# Patient Record
Sex: Female | Born: 1965
Health system: Southern US, Community
[De-identification: ages and names within clinical notes are randomized; demographics above are authoritative.]

## PROBLEM LIST (undated history)

## (undated) DIAGNOSIS — J45909 Unspecified asthma, uncomplicated: Secondary | ICD-10-CM

## (undated) DIAGNOSIS — R51 Headache: Secondary | ICD-10-CM

## (undated) DIAGNOSIS — F32A Depression, unspecified: Secondary | ICD-10-CM

## (undated) DIAGNOSIS — R519 Headache, unspecified: Secondary | ICD-10-CM

## (undated) DIAGNOSIS — F329 Major depressive disorder, single episode, unspecified: Secondary | ICD-10-CM

## (undated) DIAGNOSIS — Z8719 Personal history of other diseases of the digestive system: Secondary | ICD-10-CM

## (undated) DIAGNOSIS — K219 Gastro-esophageal reflux disease without esophagitis: Secondary | ICD-10-CM

## (undated) HISTORY — PX: CHOLECYSTECTOMY: SHX55

## (undated) HISTORY — PX: MANDIBLE RECONSTRUCTION: SHX431

## (undated) HISTORY — PX: LYMPH NODE BIOPSY: SHX201

## (undated) HISTORY — PX: DIAGNOSTIC LAPAROSCOPY: SUR761

## (undated) HISTORY — PX: BREAST BIOPSY: SHX20

## (undated) HISTORY — PX: TYMPANOSTOMY TUBE PLACEMENT: SHX32

## (undated) HISTORY — PX: OTHER SURGICAL HISTORY: SHX169

---

## 1989-02-28 HISTORY — PX: NASAL SINUS SURGERY: SHX719

## 2008-03-20 ENCOUNTER — Encounter: Admission: RE | Admit: 2008-03-20 | Discharge: 2008-03-20 | Payer: Self-pay | Admitting: Otolaryngology

## 2008-03-31 ENCOUNTER — Emergency Department (HOSPITAL_COMMUNITY): Admission: EM | Admit: 2008-03-31 | Discharge: 2008-03-31 | Payer: Self-pay | Admitting: Emergency Medicine

## 2008-04-29 ENCOUNTER — Other Ambulatory Visit: Admission: RE | Admit: 2008-04-29 | Discharge: 2008-04-29 | Payer: Self-pay | Admitting: Family Medicine

## 2008-11-25 ENCOUNTER — Encounter: Admission: RE | Admit: 2008-11-25 | Discharge: 2008-11-25 | Payer: Self-pay | Admitting: Family Medicine

## 2008-12-10 ENCOUNTER — Encounter: Admission: RE | Admit: 2008-12-10 | Discharge: 2008-12-10 | Payer: Self-pay | Admitting: Family Medicine

## 2009-04-09 ENCOUNTER — Encounter: Admission: RE | Admit: 2009-04-09 | Discharge: 2009-04-09 | Payer: Self-pay | Admitting: Family Medicine

## 2009-05-19 ENCOUNTER — Other Ambulatory Visit: Admission: RE | Admit: 2009-05-19 | Discharge: 2009-05-19 | Payer: Self-pay | Admitting: Family Medicine

## 2009-06-09 ENCOUNTER — Encounter: Admission: RE | Admit: 2009-06-09 | Discharge: 2009-06-09 | Payer: Self-pay | Admitting: Family Medicine

## 2009-06-11 ENCOUNTER — Encounter: Admission: RE | Admit: 2009-06-11 | Discharge: 2009-06-11 | Payer: Self-pay | Admitting: Family Medicine

## 2010-03-21 ENCOUNTER — Encounter: Payer: Self-pay | Admitting: Family Medicine

## 2010-03-23 ENCOUNTER — Other Ambulatory Visit: Payer: Self-pay | Admitting: Obstetrics and Gynecology

## 2010-05-04 ENCOUNTER — Other Ambulatory Visit: Payer: Self-pay | Admitting: Family Medicine

## 2010-05-04 DIAGNOSIS — Z139 Encounter for screening, unspecified: Secondary | ICD-10-CM

## 2010-06-15 LAB — URINALYSIS, ROUTINE W REFLEX MICROSCOPIC
Ketones, ur: NEGATIVE mg/dL
Protein, ur: NEGATIVE mg/dL
Specific Gravity, Urine: 1.011 (ref 1.005–1.030)
pH: 7.5 (ref 5.0–8.0)

## 2010-06-15 LAB — POCT CARDIAC MARKERS
CKMB, poc: <1 ng/mL — ABNORMAL LOW (ref 1.0–8.0)
Troponin i, poc: <0.05 ng/mL (ref 0.00–0.09)
Troponin i, poc: <0.05 ng/mL (ref 0.00–0.09)

## 2010-06-15 LAB — COMPREHENSIVE METABOLIC PANEL
Albumin: 3.5 g/dL (ref 3.5–5.2)
CO2: 23 mEq/L (ref 19–32)
Calcium: 8.9 mg/dL (ref 8.4–10.5)
GFR calc Af Amer: >60 mL/min (ref >60)
Total Protein: 6.1 g/dL (ref 6.0–8.3)

## 2010-06-15 LAB — LIPASE, BLOOD: Lipase: 22 U/L (ref 11–59)

## 2010-06-15 LAB — D-DIMER, QUANTITATIVE: D-Dimer, Quant: 0.32 ug/mL-FEU (ref 0.00–0.48)

## 2010-06-15 LAB — PROTIME-INR: Prothrombin Time: 12.7 seconds (ref 11.6–15.2)

## 2010-06-17 ENCOUNTER — Ambulatory Visit
Admission: RE | Admit: 2010-06-17 | Discharge: 2010-06-17 | Disposition: A | Discharge status: Home / Self Care | Payer: BC Managed Care – PPO | Source: Ambulatory Visit | Attending: Family Medicine | Admitting: Family Medicine

## 2010-06-17 DIAGNOSIS — Z139 Encounter for screening, unspecified: Secondary | ICD-10-CM

## 2010-07-15 ENCOUNTER — Emergency Department (HOSPITAL_COMMUNITY): Payer: BC Managed Care – PPO

## 2010-07-15 ENCOUNTER — Emergency Department (HOSPITAL_COMMUNITY)
Admission: EM | Admit: 2010-07-15 | Discharge: 2010-07-15 | Disposition: A | Discharge status: Home / Self Care | Payer: BC Managed Care – PPO | Attending: Emergency Medicine | Admitting: Emergency Medicine

## 2010-07-15 DIAGNOSIS — F329 Major depressive disorder, single episode, unspecified: Secondary | ICD-10-CM | POA: Insufficient documentation

## 2010-07-15 DIAGNOSIS — E7800 Pure hypercholesterolemia, unspecified: Secondary | ICD-10-CM | POA: Insufficient documentation

## 2010-07-15 DIAGNOSIS — M549 Dorsalgia, unspecified: Secondary | ICD-10-CM | POA: Insufficient documentation

## 2010-07-15 DIAGNOSIS — K219 Gastro-esophageal reflux disease without esophagitis: Secondary | ICD-10-CM | POA: Insufficient documentation

## 2010-07-15 DIAGNOSIS — F3289 Other specified depressive episodes: Secondary | ICD-10-CM | POA: Insufficient documentation

## 2010-07-15 DIAGNOSIS — I10 Essential (primary) hypertension: Secondary | ICD-10-CM | POA: Insufficient documentation

## 2010-07-15 DIAGNOSIS — G43909 Migraine, unspecified, not intractable, without status migrainosus: Secondary | ICD-10-CM | POA: Insufficient documentation

## 2010-07-15 DIAGNOSIS — Z87442 Personal history of urinary calculi: Secondary | ICD-10-CM | POA: Insufficient documentation

## 2010-07-15 DIAGNOSIS — Z79899 Other long term (current) drug therapy: Secondary | ICD-10-CM | POA: Insufficient documentation

## 2010-07-15 DIAGNOSIS — K589 Irritable bowel syndrome without diarrhea: Secondary | ICD-10-CM | POA: Insufficient documentation

## 2010-07-15 LAB — URINALYSIS, ROUTINE W REFLEX MICROSCOPIC
Bilirubin Urine: NEGATIVE
Ketones, ur: NEGATIVE mg/dL
Protein, ur: NEGATIVE mg/dL
pH: 7 (ref 5.0–8.0)

## 2010-07-15 LAB — POCT I-STAT, CHEM 8
BUN: 11 mg/dL (ref 6–23)
Chloride: 110 mEq/L (ref 96–112)
Creatinine, Ser: 1.2 mg/dL (ref 0.4–1.2)
Potassium: 3.8 mEq/L (ref 3.5–5.1)

## 2010-07-18 LAB — URINE CULTURE: Colony Count: 40000

## 2011-04-12 ENCOUNTER — Other Ambulatory Visit (HOSPITAL_COMMUNITY): Payer: Self-pay | Admitting: Family Medicine

## 2011-04-12 DIAGNOSIS — R4702 Dysphasia: Secondary | ICD-10-CM

## 2011-04-15 ENCOUNTER — Ambulatory Visit (HOSPITAL_COMMUNITY)
Admission: RE | Admit: 2011-04-15 | Discharge: 2011-04-15 | Disposition: A | Discharge status: Home / Self Care | Payer: BC Managed Care – PPO | Source: Ambulatory Visit | Attending: Family Medicine | Admitting: Family Medicine

## 2011-04-15 DIAGNOSIS — R1310 Dysphagia, unspecified: Secondary | ICD-10-CM | POA: Insufficient documentation

## 2011-04-15 DIAGNOSIS — R4702 Dysphasia: Secondary | ICD-10-CM

## 2011-04-15 DIAGNOSIS — K219 Gastro-esophageal reflux disease without esophagitis: Secondary | ICD-10-CM | POA: Insufficient documentation

## 2011-04-15 DIAGNOSIS — K224 Dyskinesia of esophagus: Secondary | ICD-10-CM | POA: Insufficient documentation

## 2011-05-04 ENCOUNTER — Encounter: Payer: Self-pay | Admitting: Internal Medicine

## 2011-05-05 ENCOUNTER — Ambulatory Visit: Payer: BC Managed Care – PPO | Admitting: Internal Medicine

## 2011-06-27 ENCOUNTER — Other Ambulatory Visit: Payer: Self-pay | Admitting: Family Medicine

## 2011-06-27 DIAGNOSIS — Z1231 Encounter for screening mammogram for malignant neoplasm of breast: Secondary | ICD-10-CM

## 2011-07-01 ENCOUNTER — Ambulatory Visit
Admission: RE | Admit: 2011-07-01 | Discharge: 2011-07-01 | Disposition: A | Discharge status: Home / Self Care | Payer: BC Managed Care – PPO | Source: Ambulatory Visit | Attending: Family Medicine | Admitting: Family Medicine

## 2011-07-01 DIAGNOSIS — Z1231 Encounter for screening mammogram for malignant neoplasm of breast: Secondary | ICD-10-CM

## 2011-07-05 ENCOUNTER — Other Ambulatory Visit: Payer: Self-pay | Admitting: Family Medicine

## 2011-07-05 DIAGNOSIS — R928 Other abnormal and inconclusive findings on diagnostic imaging of breast: Secondary | ICD-10-CM

## 2011-07-11 ENCOUNTER — Ambulatory Visit
Admission: RE | Admit: 2011-07-11 | Discharge: 2011-07-11 | Disposition: A | Discharge status: Home / Self Care | Payer: BC Managed Care – PPO | Source: Ambulatory Visit | Attending: Family Medicine | Admitting: Family Medicine

## 2011-07-11 DIAGNOSIS — R928 Other abnormal and inconclusive findings on diagnostic imaging of breast: Secondary | ICD-10-CM

## 2011-07-14 ENCOUNTER — Other Ambulatory Visit (HOSPITAL_COMMUNITY)
Admission: RE | Admit: 2011-07-14 | Discharge: 2011-07-14 | Disposition: A | Discharge status: Home / Self Care | Payer: BC Managed Care – PPO | Source: Ambulatory Visit | Attending: Family Medicine | Admitting: Family Medicine

## 2011-07-14 ENCOUNTER — Other Ambulatory Visit: Payer: Self-pay | Admitting: Physician Assistant

## 2011-07-14 DIAGNOSIS — R87810 Cervical high risk human papillomavirus (HPV) DNA test positive: Secondary | ICD-10-CM | POA: Insufficient documentation

## 2011-07-14 DIAGNOSIS — Z0000 Encounter for general adult medical examination without abnormal findings: Secondary | ICD-10-CM | POA: Insufficient documentation

## 2011-08-18 ENCOUNTER — Other Ambulatory Visit: Payer: Self-pay | Admitting: Obstetrics and Gynecology

## 2011-12-06 ENCOUNTER — Other Ambulatory Visit: Payer: Self-pay | Admitting: Family Medicine

## 2011-12-06 DIAGNOSIS — R921 Mammographic calcification found on diagnostic imaging of breast: Secondary | ICD-10-CM

## 2012-01-06 ENCOUNTER — Other Ambulatory Visit: Payer: Self-pay | Admitting: Family Medicine

## 2012-01-06 ENCOUNTER — Ambulatory Visit
Admission: RE | Admit: 2012-01-06 | Discharge: 2012-01-06 | Disposition: A | Discharge status: Home / Self Care | Payer: BC Managed Care – PPO | Source: Ambulatory Visit | Attending: Family Medicine | Admitting: Family Medicine

## 2012-01-06 DIAGNOSIS — R921 Mammographic calcification found on diagnostic imaging of breast: Secondary | ICD-10-CM

## 2012-04-26 ENCOUNTER — Other Ambulatory Visit: Payer: Self-pay | Admitting: Family Medicine

## 2012-04-26 DIAGNOSIS — R921 Mammographic calcification found on diagnostic imaging of breast: Secondary | ICD-10-CM

## 2012-06-28 ENCOUNTER — Other Ambulatory Visit: Payer: Self-pay | Admitting: Family Medicine

## 2012-06-28 ENCOUNTER — Ambulatory Visit
Admission: RE | Admit: 2012-06-28 | Discharge: 2012-06-28 | Disposition: A | Discharge status: Home / Self Care | Payer: BC Managed Care – PPO | Source: Ambulatory Visit | Attending: Family Medicine | Admitting: Family Medicine

## 2012-06-28 DIAGNOSIS — R921 Mammographic calcification found on diagnostic imaging of breast: Secondary | ICD-10-CM

## 2012-07-24 ENCOUNTER — Other Ambulatory Visit (HOSPITAL_COMMUNITY)
Admission: RE | Admit: 2012-07-24 | Discharge: 2012-07-24 | Disposition: A | Discharge status: Home / Self Care | Payer: BC Managed Care – PPO | Source: Ambulatory Visit | Attending: Obstetrics and Gynecology | Admitting: Obstetrics and Gynecology

## 2012-07-24 ENCOUNTER — Other Ambulatory Visit: Payer: Self-pay | Admitting: Nurse Practitioner

## 2012-07-24 DIAGNOSIS — Z01419 Encounter for gynecological examination (general) (routine) without abnormal findings: Secondary | ICD-10-CM | POA: Insufficient documentation

## 2012-11-27 ENCOUNTER — Other Ambulatory Visit: Payer: Self-pay | Admitting: Family Medicine

## 2012-11-27 ENCOUNTER — Ambulatory Visit
Admission: RE | Admit: 2012-11-27 | Discharge: 2012-11-27 | Disposition: A | Discharge status: Home / Self Care | Payer: BC Managed Care – PPO | Source: Ambulatory Visit | Attending: Physician Assistant | Admitting: Physician Assistant

## 2012-11-27 DIAGNOSIS — M25561 Pain in right knee: Secondary | ICD-10-CM

## 2012-11-28 ENCOUNTER — Other Ambulatory Visit: Payer: Self-pay | Admitting: Family Medicine

## 2012-11-28 DIAGNOSIS — M25561 Pain in right knee: Secondary | ICD-10-CM

## 2013-01-01 ENCOUNTER — Other Ambulatory Visit: Payer: Self-pay | Admitting: Gastroenterology

## 2013-04-29 ENCOUNTER — Other Ambulatory Visit: Payer: Self-pay | Admitting: Occupational Medicine

## 2013-04-29 ENCOUNTER — Ambulatory Visit: Payer: Self-pay

## 2013-04-29 DIAGNOSIS — M25512 Pain in left shoulder: Secondary | ICD-10-CM

## 2013-05-16 ENCOUNTER — Other Ambulatory Visit: Payer: Self-pay

## 2013-05-16 DIAGNOSIS — Z1231 Encounter for screening mammogram for malignant neoplasm of breast: Secondary | ICD-10-CM

## 2013-07-02 ENCOUNTER — Ambulatory Visit
Admission: RE | Admit: 2013-07-02 | Discharge: 2013-07-02 | Disposition: A | Discharge status: Home / Self Care | Payer: No Typology Code available for payment source | Source: Ambulatory Visit

## 2013-07-02 ENCOUNTER — Encounter (INDEPENDENT_AMBULATORY_CARE_PROVIDER_SITE_OTHER): Payer: Self-pay

## 2013-07-02 DIAGNOSIS — Z1231 Encounter for screening mammogram for malignant neoplasm of breast: Secondary | ICD-10-CM

## 2013-11-12 ENCOUNTER — Other Ambulatory Visit: Payer: Self-pay | Admitting: Family Medicine

## 2013-11-12 ENCOUNTER — Ambulatory Visit
Admission: RE | Admit: 2013-11-12 | Discharge: 2013-11-12 | Disposition: A | Discharge status: Home / Self Care | Payer: No Typology Code available for payment source | Source: Ambulatory Visit | Attending: Family Medicine | Admitting: Family Medicine

## 2013-11-12 DIAGNOSIS — M25531 Pain in right wrist: Secondary | ICD-10-CM

## 2013-11-12 DIAGNOSIS — M25511 Pain in right shoulder: Secondary | ICD-10-CM

## 2014-04-21 ENCOUNTER — Other Ambulatory Visit: Payer: Self-pay

## 2014-04-21 DIAGNOSIS — Z1231 Encounter for screening mammogram for malignant neoplasm of breast: Secondary | ICD-10-CM

## 2014-05-30 ENCOUNTER — Other Ambulatory Visit (HOSPITAL_COMMUNITY)
Admission: RE | Admit: 2014-05-30 | Discharge: 2014-05-30 | Disposition: A | Discharge status: Home / Self Care | Payer: No Typology Code available for payment source | Source: Ambulatory Visit | Attending: Obstetrics & Gynecology | Admitting: Obstetrics & Gynecology

## 2014-05-30 ENCOUNTER — Other Ambulatory Visit: Payer: Self-pay | Admitting: Obstetrics & Gynecology

## 2014-05-30 DIAGNOSIS — Z01419 Encounter for gynecological examination (general) (routine) without abnormal findings: Secondary | ICD-10-CM | POA: Insufficient documentation

## 2014-06-02 LAB — CYTOLOGY - PAP

## 2014-06-18 ENCOUNTER — Encounter: Payer: Self-pay | Admitting: Physical Therapy

## 2014-06-18 ENCOUNTER — Ambulatory Visit: Payer: No Typology Code available for payment source | Attending: Obstetrics & Gynecology | Admitting: Physical Therapy

## 2014-06-18 DIAGNOSIS — R32 Unspecified urinary incontinence: Secondary | ICD-10-CM | POA: Insufficient documentation

## 2014-06-18 DIAGNOSIS — N8189 Other female genital prolapse: Secondary | ICD-10-CM

## 2014-06-18 DIAGNOSIS — M6289 Other specified disorders of muscle: Secondary | ICD-10-CM

## 2014-06-18 DIAGNOSIS — M545 Low back pain, unspecified: Secondary | ICD-10-CM

## 2014-06-18 DIAGNOSIS — N8184 Pelvic muscle wasting: Secondary | ICD-10-CM | POA: Insufficient documentation

## 2014-06-18 NOTE — Therapy (Signed)
[facility] [address] [place], [place], [address] Phone: [phone]   Fax:  [phone]  Physical Therapy Evaluation  Patient Details  Name: Morgan Hooper MRN: [id number] Date of Birth: [birth year] Referring Provider:  [name], DO  Encounter Date: 06/18/2014      PT End of Session - 06/18/14 1710    Visit Number 1   Date for PT Re-Evaluation 09/10/14   PT Start Time 1324   PT Stop Time 1700   PT Time Calculation (min) 45 min   Activity Tolerance Patient tolerated treatment well   Behavior During Therapy [facility] for tasks assessed/performed      History reviewed. No pertinent past medical history.  History reviewed. No pertinent past surgical history.  There were no vitals filed for this visit.  Visit Diagnosis:  Pelvic floor dysfunction - Plan: PT plan of care cert/re-cert  Pelvic floor weakness in female - Plan: PT plan of care cert/re-cert  Midline low back pain without sciatica - Plan: PT plan of care cert/re-cert      Subjective Assessment - 06/18/14 1629    Subjective Patient reports pelvic pain, prolapsing uterus.  Patient has pain with intercourse.  Urinary leakage with laughing, running. Patient gets the urge to  urinate.  Patient reports back pain. Pain has been getting worse since  training in mud run. Patient unable to be on top during intercourse due to pain.  Positions are husband on top or on side is most comfortable.    Limitations Other (comment)  intercourse   How long can you stand comfortably? standing helps   How long can you walk comfortably? needs to have a bathroom available during walks   Patient Stated Goals reduce urinary leakage, reduce pain   Currently in Pain? Yes   Pain Score 10-Worst pain ever  low 4/10   Pain Location Vagina  back, pelvic   Pain Orientation Mid;Lower;Right   Pain Descriptors / Indicators Aching;Burning;Stabbing  grabbing   Pain Type Chronic  pain   Pain Onset More than a month ago  2 months   Pain Frequency Intermittent   Aggravating Factors  transferring patients, running, intercourse   Pain Relieving Factors changing positions, change positions with intercourse   Effect of Pain on Daily Activities intercourse   Multiple Pain Sites No            [facility] PT Assessment - 06/18/14 0001    Assessment   Medical Diagnosis Pelvic pain   Onset Date 03/31/14   Next MD Visit 06/24/2014   Prior Therapy None   Precautions   Precautions None   Balance Screen   Has the patient fallen in the past 6 months No   Has the patient had a decrease in activity level because of a fear of falling?  No   Is the patient reluctant to leave their home because of a fear of falling?  No   Prior Function   Level of Independence Independent with basic ADLs   Observation/Other Assessments   Focus on Therapeutic Outcomes (FOTO)  None   Posture/Postural Control   Posture/Postural Control Postural limitations   Postural Limitations Rounded Shoulders;Forward head;Increased thoracic kyphosis   ROM / Strength   AROM / PROM / Strength --  bil. LE strength is 5/5    AROM   Lumbar Extension full  motion at L2-L4   Lumbar - Right Side Bend full   Lumbar - Left Side Bend full   Lumbar -  Right Rotation decreased by 50%   Lumbar - Left Rotation decreased by 50%   Palpation   Palpation left post. rotated ilium, tenderness located in right lower abdominal and psoas                 Pelvic Floor Special Questions - 06/18/14 0001    Prior Pregnancies Yes   Number of Pregnancies 2   Number of Vaginal Deliveries 2   Any difficulty with labor and deliveries --  20 stitches for episiotomy   Diastasis Recti No   Marinoff Scale pain interrupts completion  unable to be on top due to pain   Urinary Leakage Yes   Pad use No   Activities that cause leaking With strong urge;Laughing   Urinary urgency Yes   Falling out feeling (prolapse) Yes   cervical prolapse   External Palpation tenderness located in bil. ischiocavernosus, bulbocavernosus, transverperineum   Prolapse Uterine   Pelvic Floor Internal Exam Patient confirms identification and approved physical therapist to perform muscle strength and integrity assessment   Exam Type Vaginal   Palpation tenderness located in bil. puborectalis, obturator internist, bil. iliococcygeus, bil. coccygeus   Strength weak squeeze, no lift   Tone hypertonic          [facility] Adult PT Treatment/Exercise - 06/18/14 0001    Manual Therapy   Manual Therapy Other (comment)   Other Manual Therapy muscle energy technique to correct anterior rotated pelvis                PT Education - 06/18/14 1710    Education provided No          PT Short Term Goals - 06/18/14 1716    PT SHORT TERM GOAL #1   Title understand how to perform perineal soft tissue work   Time 4   Period Weeks   Status New   PT SHORT TERM GOAL #2   Title correct sitting posture to decrease strain on the pelvic floor   Time 4   Period Weeks   Status New   PT SHORT TERM GOAL #3   Title understand diaphragmatic breathing to enhance relaxation of the pelvic floor   Time 4   Period Weeks   Status New   PT SHORT TERM GOAL #4   Title pain with daily activities decreased >/= 25%   Time 4   Period Weeks   Status New   PT SHORT TERM GOAL #5   Title pain with intercourse decreased >/= 25%   Time 4   Period Weeks   Status New           PT Long Term Goals - 06/18/14 1717    PT LONG TERM GOAL #1   Title pain with intercourse decreased >/= 60%   Time 12   Period Weeks   Status New   PT LONG TERM GOAL #2   Title pain with daily activities decreased >/= 60%   Time 12   Period Weeks   Status New   PT LONG TERM GOAL #3   Title pelvic floor strength >/= 3/5 with 10 second contraction    Time 12   Period Weeks   Status New   PT LONG TERM GOAL #4   Title independent with pelvic floor and back  stabilization exercise to further progress her strength   Time 12   Period Weeks   Status New  Plan - 06/18/14 1710    Clinical Impression Statement Patient is a 49 year old female with pelvic floor pain that has become worse in the past 2 months since she has been training for a mud run.  Patient a hypertonic pelvic floor that is weak.  Patient has back pain at  L2-3 where she hinges for back movement.  Patient  has difficulty with intercourse due to pain. Patient has constipation . Patient has a uterine prolapse. Patient has urinary leakage with laughing. After eval patient pelvic was in correct alignment.    Pt will benefit from skilled therapeutic intervention in order to improve on the following deficits Impaired flexibility;Decreased endurance;Decreased activity tolerance;Increased fascial restricitons;Pain;Increased muscle spasms;Decreased mobility;Decreased strength   Rehab Potential Good   PT Frequency 2x / week   PT Duration 12 weeks   PT Treatment/Interventions Biofeedback;Cryotherapy;Electrical Stimulation;Functional mobility training;Neuromuscular re-education;Manual techniques;Ultrasound;Therapeutic exercise;Passive range of motion;Patient/family education;Therapeutic activities;Moist Heat   PT Next Visit Plan flexibility exercises, diaphragmatic breathing, ultrasound, soft tissue    PT Home Exercise Plan flexibility exercise, perineal soft tissue work   Recommended Other Services None   Consulted and Agree with Plan of Care Patient         Problem List There are no active problems to display for this patient.   GRAY,CHERYL,PT 06/18/2014, 5:28 PM  [facility] [address] [place], [place], [address] Phone: [phone]   Fax:  [phone]

## 2014-07-01 ENCOUNTER — Ambulatory Visit: Payer: No Typology Code available for payment source | Attending: Obstetrics & Gynecology | Admitting: Physical Therapy

## 2014-07-01 ENCOUNTER — Encounter: Payer: Self-pay | Admitting: Physical Therapy

## 2014-07-01 DIAGNOSIS — R32 Unspecified urinary incontinence: Secondary | ICD-10-CM | POA: Diagnosis not present

## 2014-07-01 DIAGNOSIS — M545 Low back pain: Secondary | ICD-10-CM | POA: Diagnosis not present

## 2014-07-01 DIAGNOSIS — N8184 Pelvic muscle wasting: Secondary | ICD-10-CM | POA: Insufficient documentation

## 2014-07-01 DIAGNOSIS — M6289 Other specified disorders of muscle: Secondary | ICD-10-CM

## 2014-07-01 DIAGNOSIS — N8189 Other female genital prolapse: Secondary | ICD-10-CM | POA: Diagnosis not present

## 2014-07-01 NOTE — Patient Instructions (Addendum)
Hamstring Stretch (Sitting)   Sitting, extend both leg and place hands on same thigh for support. Keeping torso straight, lean forward, sliding hands down leg, until a stretch is felt in back of thigh. Hold _30__ seconds. Repeat with other leg. 2 times.  Copyright  VHI. All rights reserved.  Adductors, Sitting With Hip Flexion   Sit with legs open in a wide V, toes pointing up, hands on knees. Keep spine straight supporting trunk with arms. Slide arms down leg as trunk tips forward. Press knees apart. Hold _30__ seconds. 2 times    Prayer stretch hold 30 seconds 2 times  Patient is able to return demonstration correctly

## 2014-07-01 NOTE — Therapy (Signed)
[facility] [address], [place], [place], [address] Phone: [phone]   Fax:  [phone]  Physical Therapy Treatment  Patient Details  Name: Morgan Hooper MRN: [id number] Date of Birth: Apr 14, 1965 Referring Provider:  [name], DO  Encounter Date: 07/01/2014      PT End of Session - 07/01/14 1154    Visit Number 2   Date for PT Re-Evaluation 09/10/14   PT Start Time 2395   PT Stop Time 1230   PT Time Calculation (min) 45 min   Activity Tolerance Patient tolerated treatment well   Behavior During Therapy [facility] for tasks assessed/performed      History reviewed. No pertinent past medical history.  History reviewed. No pertinent past surgical history.  There were no vitals filed for this visit.  Visit Diagnosis:  Pelvic floor dysfunction  Pelvic floor weakness in female      Subjective Assessment - 07/01/14 1150    Subjective Cystoscopy came back negative. I sprained my ankle with the mudrun.  I am on cruthes.    Limitations Other (comment)  intercourse   How long can you stand comfortably? standing helps   How long can you walk comfortably? needs to have a bathroom available during walks   Patient Stated Goals reduce urinary leakage, reduce pain   Currently in Pain? Yes   Pain Score 10-Worst pain ever   Pain Location Vagina   Pain Orientation Right;Lower;Mid   Pain Descriptors / Indicators Aching;Burning;Stabbing   Pain Type Chronic pain   Pain Onset More than a month ago   Pain Frequency Intermittent   Aggravating Factors  intercourse   Pain Relieving Factors change position with intercourse   Multiple Pain Sites No                         [facility] Adult PT Treatment/Exercise - 07/01/14 0001    Manual Therapy   Manual Therapy Massage;Myofascial release;Internal Pelvic Floor   Myofascial Release externally to bil. bulbocavernosus, ischip cavernosus   Internal Pelvic Floor  ot bil. levator ani and obturator internist                PT Education - 07/01/14 1226    Education provided Yes   Education Details prayer stretch, hip adductor stretch, hamstring stretch   Person(s) Educated Patient   Methods Explanation;Demonstration;Tactile cues;Verbal cues;Handout   Comprehension Returned demonstration;Verbalized understanding          PT Short Term Goals - 07/01/14 1229    PT SHORT TERM GOAL #1   Title understand how to perform perineal soft tissue work   Time 4   Period Weeks   Status New  not educated yet   PT SHORT TERM GOAL #2   Title correct sitting posture to decrease strain on the pelvic floor   Time 4   Period Weeks   Status New  Not educated yet   PT SHORT TERM GOAL #3   Title understand diaphragmatic breathing to enhance relaxation of the pelvic floor   Time 4   Period Weeks   Status New   PT SHORT TERM GOAL #4   Title pain with daily activities decreased >/= 25%   Time 4   Period Weeks   Status New   PT SHORT TERM GOAL #5   Title pain with intercourse decreased >/= 25%   Time 4   Period Weeks   Status New  PT Long Term Goals - 06/18/14 1717    PT LONG TERM GOAL #1   Title pain with intercourse decreased >/= 60%   Time 12   Period Weeks   Status New   PT LONG TERM GOAL #2   Title pain with daily activities decreased >/= 60%   Time 12   Period Weeks   Status New   PT LONG TERM GOAL #3   Title pelvic floor strength >/= 3/5 with 10 second contraction    Time 12   Period Weeks   Status New   PT LONG TERM GOAL #4   Title independent with pelvic floor and back stabilization exercise to further progress her strength   Time 12   Period Weeks   Status New               Plan - 07/01/14 1227    Clinical Impression Statement Patient is a 49 year old female with pelvic floor pain during intercourse.  Patient sprained her ankle during the mudrun and is notweightbear on the left ankle with crutches.   Patient had decreased tenderness in the pelvic floor musces.  Patient has not met any goals due to this being her first treatment session.    Pt will benefit from skilled therapeutic intervention in order to improve on the following deficits Impaired flexibility;Decreased endurance;Decreased activity tolerance;Increased fascial restricitons;Pain;Increased muscle spasms;Decreased mobility;Decreased strength   Rehab Potential Good   Clinical Impairments Affecting Rehab Potential None   PT Frequency 2x / week   PT Duration 12 weeks   PT Treatment/Interventions Biofeedback;Cryotherapy;Electrical Stimulation;Functional mobility training;Neuromuscular re-education;Manual techniques;Ultrasound;Therapeutic exercise;Passive range of motion;Patient/family education;Therapeutic activities;Moist Heat   PT Next Visit Plan soft tissue work, diagphragmatic breathing, posture, abdominal massage   PT Home Exercise Plan flexibility exercise, perineal soft tissue work   Oncologist with Plan of Care Patient        Problem List There are no active problems to display for this patient.   GRAY,CHERYL,PT 07/01/2014, 12:31 PM  [facility] [address], [place], [place], [address] Phone: [phone]   Fax:  [phone]

## 2014-07-04 ENCOUNTER — Ambulatory Visit
Admission: RE | Admit: 2014-07-04 | Discharge: 2014-07-04 | Disposition: A | Discharge status: Home / Self Care | Payer: No Typology Code available for payment source | Source: Ambulatory Visit

## 2014-07-04 DIAGNOSIS — Z1231 Encounter for screening mammogram for malignant neoplasm of breast: Secondary | ICD-10-CM

## 2014-07-07 ENCOUNTER — Ambulatory Visit: Payer: No Typology Code available for payment source | Admitting: Physical Therapy

## 2014-07-07 ENCOUNTER — Encounter: Payer: Self-pay | Admitting: Physical Therapy

## 2014-07-07 DIAGNOSIS — M545 Low back pain, unspecified: Secondary | ICD-10-CM

## 2014-07-07 DIAGNOSIS — M6289 Other specified disorders of muscle: Secondary | ICD-10-CM

## 2014-07-07 DIAGNOSIS — N8189 Other female genital prolapse: Secondary | ICD-10-CM

## 2014-07-07 DIAGNOSIS — N8184 Pelvic muscle wasting: Secondary | ICD-10-CM | POA: Diagnosis not present

## 2014-07-07 NOTE — Patient Instructions (Addendum)
Abdominal Bracing With Pelvic Floor (Hook-Lying)   With neutral spine, tighten pelvic floor and abdominals. Hold for 5 seconds Repeat _10__ times. Do _3__ times a day.   Copyright  VHI. All rights reserved.   Bracing With Knee Fallout (Hook-Lying)   With neutral spine, tighten pelvic floor and abdominals and hold. Alternating legs, drop knee out to side. Keep opposite hip still. Repeat _10__ times. Do __1_ times a day. Each leg. Do not let the pelvis rock.    Copyright  VHI. All rights reserved.   Bracing With Leg March (Hook-Lying)   With neutral spine, tighten pelvic floor and abdominals and hold. Alternating legs, lift foot _6__ inches and return to floor. Repeat 10___ times. Do _1__ times a day.   Copyright  VHI. All rights reserved.  Patient able to return demonstration correctly with above exercises.  PT verbally instructed patient on how to perform self soft tissue work to the perineum to the obturator internist and levator ani.  Patient able to return demonstration correctly.

## 2014-07-07 NOTE — Therapy (Signed)
[facility] [address], [place], [place], [address] Phone: [phone]   Fax:  [phone]  Physical Therapy Treatment  Patient Details  Name: Morgan Hooper MRN: [id number] Date of Birth: [birth year] Referring Provider:  [name], DO  Encounter Date: 07/07/2014      PT End of Session - 07/07/14 1534    Visit Number 3   Date for PT Re-Evaluation 09/10/14   PT Start Time 7564   PT Stop Time 3329   PT Time Calculation (min) 75 min   Activity Tolerance Patient tolerated treatment well   Behavior During Therapy [facility] for tasks assessed/performed      History reviewed. No pertinent past medical history.  History reviewed. No pertinent past surgical history.  There were no vitals filed for this visit.  Visit Diagnosis:  Pelvic floor weakness in female  Pelvic floor dysfunction  Midline low back pain without sciatica      Subjective Assessment - 07/07/14 1534    Subjective I had intercourse today without pain, and he was on top. No pain with walking right now.     Limitations Other (comment)  intercourse   Patient Stated Goals reduce urinary leakage   Currently in Pain? No/denies                         [facility] Adult PT Treatment/Exercise - 07/07/14 0001    Posture/Postural Control   Posture Comments PT verbally discussed with patient on correct sitting posture to get best pelvic floor contraction   Lumbar Exercises: Supine   Ab Set 10 reps;5 seconds  with pelvic floor contraction   Clam 20 reps  with pelvic floor contraction   Other Supine Lumbar Exercises alternate hip flexion with pelvic floor contraction 10 x each side   Manual Therapy   Manual Therapy Massage;Myofascial release;Internal Pelvic Floor   Myofascial Release externally to bil. bulbocavernosus, ischip cavernosus   Internal Pelvic Floor ot bil. levator ani and obturator internist                PT  Education - 07/07/14 1614    Education provided Yes   Education Details lower abdominal contraction with pelvic floor contraction, self soft tissue work to the pelvic floor muscles.    Person(s) Educated Patient   Methods Explanation;Demonstration;Tactile cues;Verbal cues   Comprehension Returned demonstration;Verbalized understanding          PT Short Term Goals - 07/07/14 1537    PT SHORT TERM GOAL #1   Title understand how to perform perineal soft tissue work   Time 4   Period Weeks   Status Achieved   PT SHORT TERM GOAL #2   Title correct sitting posture to decrease strain on the pelvic floor   Time 4   Period Weeks   Status Achieved   PT SHORT TERM GOAL #3   Title understand diaphragmatic breathing to enhance relaxation of the pelvic floor   Time 4   Period Weeks   Status Achieved   PT SHORT TERM GOAL #4   Title pain with daily activities decreased >/= 25%   Time 4   Period Weeks   Status Achieved   PT SHORT TERM GOAL #5   Title pain with intercourse decreased >/= 25%   Time 4   Period Weeks   Status Achieved           PT Long Term Goals - 06/18/14 1717  PT LONG TERM GOAL #1   Title pain with intercourse decreased >/= 60%   Time 12   Period Weeks   Status New   PT LONG TERM GOAL #2   Title pain with daily activities decreased >/= 60%   Time 12   Period Weeks   Status New   PT LONG TERM GOAL #3   Title pelvic floor strength >/= 3/5 with 10 second contraction    Time 12   Period Weeks   Status New   PT LONG TERM GOAL #4   Title independent with pelvic floor and back stabilization exercise to further progress her strength   Time 12   Period Weeks   Status New               Plan - 07/07/14 1617    Clinical Impression Statement Patient is a 49 year old female with pelvic pain and weakness.  Patient pelvic pain has decreased by 50% with activities and palpation.  Patient is independent with performing her self soft tissue work to the  perineum independently. Patient was able to have intercourse without pain but she was not on top. Patient has met all of her STG's.    Pt will benefit from skilled therapeutic intervention in order to improve on the following deficits Impaired flexibility;Decreased endurance;Decreased activity tolerance;Increased fascial restricitons;Pain;Increased muscle spasms;Decreased mobility;Decreased strength   Rehab Potential Good   Clinical Impairments Affecting Rehab Potential None   PT Frequency 2x / week   PT Duration 12 weeks   PT Treatment/Interventions Biofeedback;Cryotherapy;Electrical Stimulation;Functional mobility training;Neuromuscular re-education;Manual techniques;Ultrasound;Therapeutic exercise;Passive range of motion;Patient/family education;Therapeutic activities;Moist Heat   PT Next Visit Plan soft tissue work, core strengthening   PT Home Exercise Plan current HEP   Consulted and Agree with Plan of Care Patient        Problem List There are no active problems to display for this patient.   Alany Borman,PT 07/07/2014, 4:22 PM  [facility] [address] [place], [place], [address] Phone: [phone]   Fax:  [phone]

## 2014-07-10 ENCOUNTER — Other Ambulatory Visit: Payer: Self-pay | Admitting: Family Medicine

## 2014-07-10 DIAGNOSIS — N63 Unspecified lump in unspecified breast: Secondary | ICD-10-CM

## 2014-07-15 ENCOUNTER — Encounter: Payer: Self-pay | Admitting: Physical Therapy

## 2014-07-17 ENCOUNTER — Ambulatory Visit
Admission: RE | Admit: 2014-07-17 | Discharge: 2014-07-17 | Disposition: A | Discharge status: Home / Self Care | Payer: No Typology Code available for payment source | Source: Ambulatory Visit | Attending: Family Medicine | Admitting: Family Medicine

## 2014-07-17 DIAGNOSIS — N63 Unspecified lump in unspecified breast: Secondary | ICD-10-CM

## 2014-07-22 ENCOUNTER — Ambulatory Visit: Payer: No Typology Code available for payment source | Admitting: Physical Therapy

## 2014-07-22 ENCOUNTER — Encounter: Payer: Self-pay | Admitting: Physical Therapy

## 2014-07-22 DIAGNOSIS — N8184 Pelvic muscle wasting: Secondary | ICD-10-CM | POA: Diagnosis not present

## 2014-07-22 DIAGNOSIS — N8189 Other female genital prolapse: Secondary | ICD-10-CM

## 2014-07-22 NOTE — Patient Instructions (Signed)
Cough: Phase 2 (Supine)   Lie flat. Squeeze pelvic floor and hold. Inhale. Lift head and shoulders. Exhale. Relax. Repeat ___ times. Do ___ times a day.   Copyright  VHI. All rights reserved.  Cough: Phase 3 (Supine)   Lie flat. Squeeze pelvic floor and hold. Inhale. Lift head and shoulders. Cough. Relax. Repeat _5__ times. Do _2__ times a day.   Copyright  VHI. All rights reserved.   Laugh: Phase 3 (Supine)   Squeeze pelvic floor and hold. Inhale. Lift head and shoulders. Laugh. Relax. Repeat _5__ times. Do _2__ times a day.   Copyright  VHI. All rights reserved.  Sneeze: Phase 3 (Supine)   Squeeze pelvic floor and hold. Inhale. Lift head and shoulders. Pretend to sneeze. Relax. Repeat _5__ times. Do _2__ times a day.   Copyright  VHI. All rights reserved.  Slow Contraction: Gravity Resisted (Sitting)   Sitting, slowly squeeze pelvic floor for 5___ seconds. Rest for 5___ seconds. Repeat _10__ times. Do _3__ times a day. End with 5 quick flicks. Copyright  VHI. All rights reserved.  Squat   Keep back erect and shoulder blades together.  Do not hold your breath. Standing, squeeze pelvic floor and hold. Squat. Relax. When you squat to lift up and object.  Copyright  VHI. All rights reserved.  [place] [address], [place],  [address] Phone # [phone] Fax [phone]

## 2014-07-22 NOTE — Therapy (Signed)
[facility] [address] [place], [place], [address] Phone: [phone]   Fax:  [phone]  Physical Therapy Treatment  Patient Details  Name: Morgan Hooper MRN: [id number] Date of Birth: [birth year] Referring Provider:  [name], DO  Encounter Date: 07/22/2014      PT End of Session - 07/22/14 1635    Visit Number 4   Date for PT Re-Evaluation 09/10/14   PT Start Time 6962   PT Stop Time 1655   PT Time Calculation (min) 40 min   Activity Tolerance Patient tolerated treatment well   Behavior During Therapy [facility] for tasks assessed/performed      History reviewed. No pertinent past medical history.  History reviewed. No pertinent past surgical history.  There were no vitals filed for this visit.  Visit Diagnosis:  Pelvic floor weakness in female      Subjective Assessment - 07/22/14 1618    Subjective I am leaving friday for a trip Patient reports ability to have intercourse on top without pain. I have not had urinary leakage since last visit. Patient has not felt the falling out feeling in awhile.    How long can you walk comfortably? needs to have a bathroom available during walks   Patient Stated Goals reduce urinary leakage   Currently in Pain? No/denies                      Pelvic Floor Special Questions - 07/22/14 0001    Pelvic Floor Internal Exam Patient confirms identification and approved physical therapist to perform muscle strength and integrity assessment   Exam Type Vaginal   Palpation No palpable tenderness in pelvic floor, Palpation of pelvic floor while patient is coughing and laughing to guide her with pulling up the pelvic floor instead of bearing down   Strength fair squeeze, definite lift   Strength # of reps 5   Strength # of seconds 8      Patient was educated on how to transfer patient and lift with pelvic floor contraction to prevent further prolapse and  urinary leakage.  Patient required verbal cues to fully contract the pelvic floor and breath correctly.              PT Education - 07/22/14 1655    Education provided Yes   Education Details pelvic floor contraction with sneezing, laughing, coughing, lifting, and transfering a patient   Person(s) Educated Patient   Methods Explanation;Demonstration;Tactile cues;Verbal cues;Handout   Comprehension Returned demonstration;Verbalized understanding          PT Short Term Goals - 07/07/14 1537    PT SHORT TERM GOAL #1   Title understand how to perform perineal soft tissue work   Time 4   Period Weeks   Status Achieved   PT SHORT TERM GOAL #2   Title correct sitting posture to decrease strain on the pelvic floor   Time 4   Period Weeks   Status Achieved   PT SHORT TERM GOAL #3   Title understand diaphragmatic breathing to enhance relaxation of the pelvic floor   Time 4   Period Weeks   Status Achieved   PT SHORT TERM GOAL #4   Title pain with daily activities decreased >/= 25%   Time 4   Period Weeks   Status Achieved   PT SHORT TERM GOAL #5   Title pain with intercourse decreased >/= 25%   Time 4   Period Weeks  Status Achieved           PT Long Term Goals - 07/22/14 1620    PT LONG TERM GOAL #1   Title pain with intercourse decreased >/= 60%   Time 12   Period Weeks   Status Achieved   PT LONG TERM GOAL #2   Title pain with daily activities decreased >/= 60%   Time 12   Period Weeks   Status Achieved   PT LONG TERM GOAL #3   Status --  3/5 for 8 seconds   PT LONG TERM GOAL #4   Title independent with pelvic floor and back stabilization exercise to further progress her strength   Time 12   Period Weeks   Status New  still learning               Plan - 07/22/14 1657    Clinical Impression Statement No palapable tenderness located in the pelvic floor muscle. Pelvic floor strength has increased to 3/5.  Patietn requires tactile and verbal  cues to keep pelvic floor muscle contracted while coghing and laughing.  Patient has met LTG # 1 and 2.    Pt will benefit from skilled therapeutic intervention in order to improve on the following deficits Impaired flexibility;Decreased endurance;Decreased activity tolerance;Increased fascial restricitons;Pain;Increased muscle spasms;Decreased mobility;Decreased strength   Rehab Potential Good   Clinical Impairments Affecting Rehab Potential None   PT Frequency 2x / week   PT Duration 12 weeks   PT Treatment/Interventions Biofeedback;Cryotherapy;Electrical Stimulation;Functional mobility training;Neuromuscular re-education;Manual techniques;Ultrasound;Therapeutic exercise;Passive range of motion;Patient/family education;Therapeutic activities;Moist Heat   PT Next Visit Plan D/C at next visit due to having a hysterectomy on 08/20/2014   PT Home Exercise Plan current HEP   Consulted and Agree with Plan of Care Patient        Problem List There are no active problems to display for this patient.   Shayann Garbutt,PT 07/22/2014, 5:03 PM   [facility] [address] [place], [place], [address] Phone: [phone]   Fax:  [phone]

## 2014-07-24 ENCOUNTER — Ambulatory Visit: Payer: No Typology Code available for payment source | Attending: Orthopedic Surgery | Admitting: Physical Therapy

## 2014-07-24 ENCOUNTER — Encounter: Payer: Self-pay | Admitting: Physical Therapy

## 2014-07-24 DIAGNOSIS — M25572 Pain in left ankle and joints of left foot: Secondary | ICD-10-CM | POA: Insufficient documentation

## 2014-07-24 DIAGNOSIS — R29898 Other symptoms and signs involving the musculoskeletal system: Secondary | ICD-10-CM | POA: Insufficient documentation

## 2014-07-24 DIAGNOSIS — M259 Joint disorder, unspecified: Secondary | ICD-10-CM | POA: Insufficient documentation

## 2014-07-24 DIAGNOSIS — M25673 Stiffness of unspecified ankle, not elsewhere classified: Secondary | ICD-10-CM

## 2014-07-24 NOTE — Patient Instructions (Addendum)
Gastroc / Heel Cord Stretch - Seated With Towel   Sit on floor, towel around ball of foot. Gently pull foot in toward body, stretching heel cord and calf. Hold for _30__ seconds. Repeat on involved leg. Repeat _3__ times. Do _2__ times per day.   Forefoot Evertors   Place right foot flat on towel, knee pointed forward. Use forefoot and toes to push towel out to side. Do not allow heel or knee to move. Hold _3___ seconds. Repeat _10___ times. Do __2__ sessions per day. CAUTION: Repetitions should be slow and controlled.  Forefoot Invertors   Place right foot flat on towel, knee pointed forward. Use forefoot and toes to pull towel in toward center. Do not allow heel or knee to move. Hold _3___ seconds. Repeat __10__ times. Do __2__ sessions per day. CAUTION: Repetitions should be slow and controlled.   TOES: Towel Bunching   With involved straight toes on towel, bend toes bunching up towel _15__ times. Do _2__ times per day.  Copyright  VHI. All rights reserved.   Ankle Pump   Place pillow under calf so foot does not rub. Slowly bend and straighten ankle to full range. Hold _1___ seconds each direction. Repeat _10___ times. Do _3___ sessions per day.  Copyright  VHI. All rights reserved.   IONTOPHORESIS PATIENT PRECAUTIONS & CONTRAINDICATIONS:  . Redness under one or both electrodes can occur.  This characterized by a uniform redness that usually disappears within 12 hours of treatment. . Small pinhead size blisters may result in response to the drug.  Contact your physician if the problem persists more than 24 hours. . On rare occasions, iontophoresis therapy can result in temporary skin reactions such as rash, inflammation, irritation or burns.  The skin reactions may be the result of individual sensitivity to the ionic solution used, the condition of the skin at the start of treatment, reaction to the materials in the electrodes, allergies or sensitivity to  dexamethasone, or a poor connection between the patch and your skin.  Discontinue using iontophoresis if you have any of these reactions and report to your therapist. . Remove the Patch or electrodes if you have any undue sensation of pain or burning during the treatment and report discomfort to your therapist. . Tell your Therapist if you have had known adverse reactions to the application of electrical current. . If using the Patch, the LED light will turn off when treatment is complete and the patch can be removed.  Approximate treatment time is 1-3 hours.  Remove the patch when light goes off or after 6 hours. . The Patch can be worn during normal activity, however excessive motion where the electrodes have been placed can cause poor contact between the skin and the electrode or uneven electrical current resulting in greater risk of skin irritation. Marland Kitchen Keep out of the reach of children.   . DO NOT use if you have a cardiac pacemaker or any other electrically sensitive implanted device. . DO NOT use if you have a known sensitivity to dexamethasone. . DO NOT use during Magnetic Resonance Imaging (MRI). . DO NOT use over broken or compromised skin (e.g. sunburn, cuts, or acne) due to the increased risk of skin reaction. . DO NOT SHAVE over the area to be treated:  To establish good contact between the Patch and the skin, excessive hair may be clipped. . DO NOT place the Patch or electrodes on or over your eyes, directly over your heart, or brain. Marland Kitchen DO  NOT reuse the Patch or electrodes as this may cause burns to occur. [place] [address], [place], [place] [address] Phone # [phone] Fax [phone]

## 2014-07-24 NOTE — Therapy (Signed)
[facility] [address], [place], [place], [address] Phone: [phone]   Fax:  [phone]  Physical Therapy Evaluation  Patient Details  Name: Morgan Hooper MRN: [id number] Date of Birth: Jul 04, 1965 Referring Provider:  Tania Ade, MD  Encounter Date: 07/24/2014      PT End of Session - 07/24/14 1049    Visit Number 1  ankle   Date for PT Re-Evaluation 09/04/14  left ankle   PT Start Time 1015   PT Stop Time 1053   PT Time Calculation (min) 38 min   Activity Tolerance Patient tolerated treatment well   Behavior During Therapy [facility] for tasks assessed/performed      History reviewed. No pertinent past medical history.  History reviewed. No pertinent past surgical history.  There were no vitals filed for this visit.  Visit Diagnosis:  Left lateral ankle pain - Plan: PT plan of care cert/re-cert  Decreased ROM of ankle - Plan: PT plan of care cert/re-cert  Ankle weakness - Plan: PT plan of care cert/re-cert      Subjective Assessment - 07/24/14 1019    Subjective I fell off an obstacle while doing a mud run while I was 5 feet up in the air and lost footing. Patient was in a walking boot for 2 1/2 weeks.    Limitations Standing;Walking;House hold activities   How long can you stand comfortably? difficult at end of day   How long can you walk comfortably? pain with stepping wrong   Patient Stated Goals decease pain in left ankle   Currently in Pain? Yes   Pain Score 6    Pain Location Ankle   Pain Orientation Left   Pain Descriptors / Indicators Sharp;Aching   Pain Type Acute pain   Pain Onset 1 to 4 weeks ago   Pain Frequency Intermittent   Aggravating Factors  weightbearing, hit ankle wrong,    Pain Relieving Factors elevate   Effect of Pain on Daily Activities walking   Multiple Pain Sites No            [facility] PT Assessment - 07/24/14 0001    Assessment   Medical Diagnosis Left  Ankle Sprain   Onset Date/Surgical Date 06/28/14   Next MD Visit 08/06/2014   Prior Therapy None   Precautions   Precautions None   Balance Screen   Has the patient fallen in the past 6 months No   Has the patient had a decrease in activity level because of a fear of falling?  No   Is the patient reluctant to leave their home because of a fear of falling?  No   Home Environment   Living Environment Private residence   Living Arrangements Spouse/significant other   Type of [place] to enter   Entrance Stairs-Number of Steps 3   Entrance Stairs-Rails Can reach both   [place] One level   Prior Function   Level of Independence Independent   Vocation Full time employment   Vocation Requirements lifting, walking   Observation/Other Assessments   Focus on Therapeutic Outcomes (FOTO)  55% limitation for left ankle   Observation/Other Assessments-Edema    Edema Figure 8   Figure 8 Edema   Figure 8 - Right  47.8 cm   Figure 8 - Left  49.5cm   AROM   Left Ankle Dorsiflexion 8   Left Ankle Plantar Flexion 30   Left Ankle Inversion 24  Left Ankle Eversion 3   PROM   Left Ankle Dorsiflexion 10   Left Ankle Plantar Flexion 33   Left Ankle Inversion 26   Left Ankle Eversion 4   Strength   Left Ankle Dorsiflexion 3+/5   Left Ankle Plantar Flexion 4/5   Left Ankle Inversion 3-/5   Left Ankle Eversion 4/5   Palpation   Palpation comment Palpable tenderness located in anterior left ankle, posterior to lateral left malleolus, and inferior to 5th metatarsal   Ambulation/Gait   Stairs Yes   Stairs Assistance 6: Modified independent (Device/Increase time)   Stair Management Technique Step to pattern  going down   Number of Stairs 3   Gait Comments limp on left due to pain and decreased dorsiflexion                    [facility] Adult PT Treatment/Exercise - 07/24/14 0001    Modalities   Modalities Iontophoresis   Iontophoresis   Type of Iontophoresis  Dexamethasone   Location lateral left ankle   Dose 1 ml   Time 6 hour                PT Education - 07/24/14 1048    Education provided Yes   Education Details ankle AROM, ice and elevate, heel cord stretch   Person(s) Educated Patient   Methods Explanation;Demonstration;Tactile cues;Verbal cues;Handout   Comprehension Returned demonstration;Verbalized understanding          PT Short Term Goals - 07/24/14 1057    PT SHORT TERM GOAL #1   Title left ankle pain with daily activities decreased >/= 25%   Time 3   Period Weeks   Status New   PT SHORT TERM GOAL #2   Title independent with initial HEP   Time 3   Period Weeks   Status New   PT SHORT TERM GOAL #3   Title swelling in left ankle with figure 8 decreased >/= 0.5 cm.   Time 3   Period Weeks   Status New   PT SHORT TERM GOAL #4   Title -   PT SHORT TERM GOAL #5   Title -           PT Long Term Goals - 07/24/14 1058    PT LONG TERM GOAL #1   Title pain with daily activities decreased >/= 75%   Time 6   Period Weeks   Status New   PT LONG TERM GOAL #2   Title swelling in left ankle decreased >/= 1cm   Time 6   Period Weeks   Status New   PT LONG TERM GOAL #3   Title left ankle strength 5/5 so she is able to perform work activities with minimal to no difficulty   Time 6   Period Weeks   Status New   PT LONG TERM GOAL #4   Title full left ankle AROM so patient can go down steps with step over step pattern   Time 6   Period Weeks   Status New   PT LONG TERM GOAL #5   Title ambulate without a limp due to decreased left ankle pain   Time 6   Period Weeks   Status New               Plan - 07/24/14 1052    Clinical Impression Statement Patient is a 49 year old female with diagnosis of left ankle sprain.  Patient injured her left  ankle on 06/28/2014 when she fell 5 feet on an obstacle course on a mud run.  Patient was initially plasce on a walking boot for 2 weeks.  Patient has limited  motion in left ankle, decreased left ankle strength, pain in left ankle and edema.  Patient ambulates with a  limp on left ankle due to pain and decresed ankle DF.  Patient has difficulty with performing her work activities including transfers and  weightbear activities. Patient  FOT score is 55% limitaiton. Patient would benefit from physical therapy to improve left ankle ROM, reduce swelling and improve strength.    Pt will benefit from skilled therapeutic intervention in order to improve on the following deficits Abnormal gait;Pain;Decreased mobility;Decreased activity tolerance;Decreased endurance;Decreased range of motion;Decreased strength;Hypomobility;Increased edema;Difficulty walking   Rehab Potential Excellent   PT Frequency 2x / week   PT Duration 6 weeks   PT Treatment/Interventions Cryotherapy;Electrical Stimulation;Gait training;Ultrasound;Moist Heat;Iontophoresis 4mg /ml Dexamethasone;Stair training;Functional mobility training;Therapeutic exercise;Therapeutic activities;Neuromuscular re-education;Manual techniques;Patient/family education;Passive range of motion;Vasopneumatic Device;Taping   PT Next Visit Plan see how iontophoresis did, left ankle isometrics, BAPS board, soft tissue work, Engineer, materials ready, trampoline   PT Home Exercise Plan ankle isometrics   Consulted and Agree with Plan of Care Patient         Problem List There are no active problems to display for this patient.   GRAY,CHERYL,PT 07/24/2014, 11:02 AM  [facility] [address] [place], [place], [address] Phone: [phone]   Fax:  [phone]

## 2014-07-29 ENCOUNTER — Encounter: Payer: Self-pay | Admitting: Physical Therapy

## 2014-07-29 ENCOUNTER — Ambulatory Visit: Payer: No Typology Code available for payment source | Admitting: Physical Therapy

## 2014-07-29 DIAGNOSIS — M6289 Other specified disorders of muscle: Secondary | ICD-10-CM

## 2014-07-29 DIAGNOSIS — N8189 Other female genital prolapse: Secondary | ICD-10-CM

## 2014-07-29 DIAGNOSIS — N8184 Pelvic muscle wasting: Secondary | ICD-10-CM | POA: Diagnosis not present

## 2014-07-29 NOTE — Therapy (Signed)
[facility] [address] [place], [place], [address] Phone: [phone]   Fax:  [phone]  Physical Therapy Treatment  Patient Details  Name: Morgan Hooper MRN: [id number] Date of Birth: 11/16/1965 Referring Provider:  Ozan, Jennifer, DO  Encounter Date: 07/29/2014      PT End of Session - 07/29/14 1622    Visit Number 5  pelvic floor   PT Start Time 1615   PT Stop Time 1700   PT Time Calculation (min) 45 min   Activity Tolerance Patient tolerated treatment well   Behavior During Therapy [facility] for tasks assessed/performed      History reviewed. No pertinent past medical history.  History reviewed. No pertinent past surgical history.  There were no vitals filed for this visit.  Visit Diagnosis:  Pelvic floor weakness in female  Pelvic floor dysfunction      Subjective Assessment - 07/29/14 1624    Subjective no pain with intercourse.  No urinary leakage.     Currently in Pain? No/denies            [facility] PT Assessment - 07/29/14 0001    Assessment   Medical Diagnosis Pelvic pain   Onset Date/Surgical Date 03/31/14   Prior Therapy None   Prior Function   Level of Independence Independent with basic ADLs   Observation/Other Assessments   Focus on Therapeutic Outcomes (FOTO)  none for pelvic floor     Physical therapist instructed patient on how to move in bed and get in and out of bed with abdominal support to prepare her for after the hysterectomy.  Patient was educated on how to have a bowel movement so she is not straining the pelvic floor before and after surgery.  Patient was able to return demonstration correctly.                         PT Education - 07/29/14 1655    Education provided Yes   Education Details pelvic floor contraction, abdominal massage, abdominal contraction, body mechanics for after hysterectomy, toileting technique   Person(s) Educated Patient   Methods Explanation;Demonstration;Tactile cues;Verbal cues;Handout   Comprehension Returned demonstration;Verbalized understanding          PT Short Term Goals - 07/29/14 1700    PT SHORT TERM GOAL #1   Title understand how to perform perineal soft tissue work   Time 4   Period Weeks   Status Achieved   PT SHORT TERM GOAL #2   Title correct sitting posture to decrease strain on the pelvic floor   Time 4   Period Weeks   Status Achieved   PT SHORT TERM GOAL #3   Title understand diaphragmatic breathing to enhance relaxation fo the pelvci floor   Time 4   Period Weeks   Status Achieved   PT SHORT TERM GOAL #4   Title pain with daily activities decreased >/= 25%    Time 4   Period Weeks   Status Achieved   PT SHORT TERM GOAL #5   Title pain with intercourse decreased >/= 25%    Time 4   Period Weeks   Status Achieved           PT Long Term Goals - 07/29/14 1702    PT LONG TERM GOAL #1   Title pain with intercourse decreased >/= 60%    Time 12   Period Weeks   Status Achieved   PT LONG   TERM GOAL #2   Title pain with daily activities decreased >/= 60%   Time 12   Status Achieved   PT LONG TERM GOAL #3   Title independent with pelvic floor and back stabilization exercise to further progress her strength   Time 12   Period Weeks   Status Achieved   PT LONG TERM GOAL #4   Title pelvic floor strength 3/5 with 10 second contraction   Time 12   Period Weeks   Status Achieved               Plan - 07/29/14 1656    Clinical Impression Statement Patient is a 56 year patient with pelvic pain.  Patient has met all of her goals.  Patient has no pain with intercourse.  Patient pelvic floor strength is 3/5. Patient had some difficulty with lower abdominal strengthening and required verbal cues.  Patient understands on how to have a bowel movement without straining .    Pt will benefit from skilled therapeutic intervention in order to improve on the following  deficits Decreased strength;Decreased endurance;Decreased mobility;Pain   Rehab Potential Excellent   Clinical Impairments Affecting Rehab Potential None   PT Frequency 2x / week   PT Duration 6 weeks   PT Treatment/Interventions Functional mobility training;Patient/family education;Manual techniques;Neuromuscular re-education;Therapeutic exercise;Therapeutic activities;Moist Heat;Ultrasound   PT Next Visit Plan Discharge patient to HEP for pelvic floor   PT Home Exercise Plan current HEP   Consulted and Agree with Plan of Care Patient        Problem List There are no active problems to display for this patient.   GRAY,CHERYL,PT 07/29/2014, 5:10 PM  [facility] [address], [address] [place], [place], [address] Phone: [phone]   Fax:  [phone] PHYSICAL THERAPY DISCHARGE SUMMARY  Visits from Start of Care: 5 Current functional level related to goals / functional outcomes: See above note for assessment of goals.  Patient has met all of her goals.  Patient pelvic floor strength is 3/5.    Remaining deficits: None   Education / Equipment: HEP  Plan: Patient agrees to discharge.  Patient goals were met. Patient is being discharged due to meeting the stated rehab goals.  Thank you for the referral. Cheryl Gray, PT 07/29/2014 5:08 PM ?????         

## 2014-07-29 NOTE — Patient Instructions (Signed)
Bracing With Arm / Leg Raise (Quadruped)   On hands and knees find neutral spine. Tighten pelvic floor and abdominals and hold. Alternating, lift arm to shoulder level and opposite leg to hip level. Repeat 15___ times. Do __1_ times a day.   Copyright  VHI. All rights reserved.   About Abdominal Massage  Abdominal massage, also called external colon massage, is a self-treatment circular massage technique that can reduce and eliminate gas and ease constipation. The colon naturally contracts in waves in a clockwise direction starting from inside the right hip, moving up toward the ribs, across the belly, and down inside the left hip.  When you perform circular abdominal massage, you help stimulate your colon's normal wave pattern of movement called peristalsis.  It is most beneficial when done after eating.  Positioning You can practice abdominal massage with oil while lying down, or in the shower with soap.  Some people find that it is just as effective to do the massage through clothing while sitting or standing.  How to Massage Start by placing your finger tips or knuckles on your right side, just inside your hip bone.  . Make small circular movements while you move upward toward your rib cage.   . Once you reach the bottom right side of your rib cage, take your circular movements across to the left side of the bottom of your rib cage.  . Next, move downward until you reach the inside of your left hip bone.  This is the path your feces travel in your colon. . Continue to perform your abdominal massage in this pattern for 10 minutes each day.     You can apply as much pressure as is comfortable in your massage.  Start gently and build pressure as you continue to practice.  Notice any areas of pain as you massage; areas of slight pain may be relieved as you massage, but if you have areas of significant or intense pain, consult with your healthcare provider.  Other Considerations . General  physical activity including bending and stretching can have a beneficial massage-like effect on the colon.  Deep breathing can also stimulate the colon because breathing deeply activates the same nervous system that supplies the colon.   . Abdominal massage should always be used in combination with a bowel-conscious diet that is high in the proper type of fiber for you, fluids (primarily water), and a regular exercise program. Toileting Techniques for Bowel Movements (Defecation) Using your belly (abdomen) and pelvic floor muscles to have a bowel movement is usually instinctive.  Sometimes people can have problems with these muscles and have to relearn proper defecation (emptying) techniques.  If you have weakness in your muscles, organs that are falling out, decreased sensation in your pelvis, or ignore your urge to go, you may find yourself straining to have a bowel movement.  You are straining if you are: . holding your breath or taking in a huge gulp of air and holding it  . keeping your lips and jaw tensed and closed tightly . turning red in the face because of excessive pushing or forcing . developing or worsening your  hemorrhoids . getting faint while pushing . not emptying completely and have to defecate many times a day  If you are straining, you are actually making it harder for yourself to have a bowel movement.  Many people find they are pulling up with the pelvic floor muscles and closing off instead of opening the anus. Due to  lack pelvic floor relaxation and coordination the abdominal muscles, one has to work harder to push the feces out.  Many people have never been taught how to defecate efficiently and effectively.  Notice what happens to your body when you are having a bowel movement.  While you are sitting on the toilet pay attention to the following areas: . Jaw and mouth position . Angle of your hips   . Whether your feet touch the ground or not . Arm placement  . Spine  position . Waist . Belly tension . Anus (opening of the anal canal)  An Evacuation/Defecation Plan   Here are the 4 basic points:  1. Lean forward enough for your elbows to rest on your knees 2. Support your feet on the floor or use a low stool if your feet don't touch the floor  3. Push out your belly as if you have swallowed a beach ball-you should feel a widening of your waist 4. Open and relax your pelvic floor muscles, rather than tightening around the anus      The following conditions my require modifications to your toileting posture:  . If you have had surgery in the past that limits your back, hip, pelvic, knee or ankle flexibility . Constipation   Your healthcare practitioner may make the following additional suggestions and adjustments:  1) Sit on the toilet  a) Make sure your feet are supported. b) Notice your hip angle and spine position-most people find it effective to lean forward or raise their knees, which can help the muscles around the anus to relax  c) When you lean forward, place your forearms on your thighs for support  2) Relax suggestions a) Breath deeply in through your nose and out slowly through your mouth as if you are smelling the flowers and blowing out the candles. b) To become aware of how to relax your muscles, contracting and releasing muscles can be helpful.  Pull your pelvic floor muscles in tightly by using the image of holding back gas, or closing around the anus (visualize making a circle smaller) and lifting the anus up and in.  Then release the muscles and your anus should drop down and feel open. Repeat 5 times ending with the feeling of relaxation. c) Keep your pelvic floor muscles relaxed; let your belly bulge out. d) The digestive tract starts at the mouth and ends at the anal opening, so be sure to relax both ends of the tube.  Place your tongue on the roof of your mouth with your teeth separated.  This helps relax your mouth and will help  to relax the anus at the same time.  3) Empty (defecation) a) Keep your pelvic floor and sphincter relaxed, then bulge your anal muscles.  Make the anal opening wide.  b) Stick your belly out as if you have swallowed a beach ball. c) Make your belly wall hard using your belly muscles while continuing to breathe. Doing this makes it easier to open your anus. d) Breath out and give a grunt (or try using other sounds such as ahhhh, shhhhh, ohhhh or grrrrrrr).  4) Finish a) As you finish your bowel movement, pull the pelvic floor muscles up and in.  This will leave your anus in the proper place rather than remaining pushed out and down. If you leave your anus pushed out and down, it will start to feel as though that is normal and give you incorrect signals about needing to have a  bowel movement.    Morgan Hooper, Morgan Hooper [facility] [address] [place], [place] [address]Bracing With Bridging (Hook-Lying)   With neutral spine, tighten pelvic floor and abdominals and hold. Lift bottom. Bring knee in and out  Repeat _20__ times. Do _1__ times a day.   Copyright  VHI. All rights reserved.  Abdominal Bracing With Pelvic Floor (Hook-Lying)   With neutral spine, tighten pelvic floor and abdominals. Blow through your hand.  Place ball between your knees and squeeze. Repeat 10___ times. Do _2__ times a day.   Copyright  VHI. All rights reserved.  [place] [address] [place], [place] [address] Phone # [phone] Fax [phone]

## 2014-08-04 ENCOUNTER — Encounter (HOSPITAL_COMMUNITY)
Admission: RE | Admit: 2014-08-04 | Discharge: 2014-08-04 | Disposition: A | Discharge status: Home / Self Care | Payer: No Typology Code available for payment source | Source: Ambulatory Visit | Attending: Obstetrics & Gynecology | Admitting: Obstetrics & Gynecology

## 2014-08-04 ENCOUNTER — Encounter (HOSPITAL_COMMUNITY): Payer: Self-pay

## 2014-08-04 ENCOUNTER — Encounter (INDEPENDENT_AMBULATORY_CARE_PROVIDER_SITE_OTHER): Payer: Self-pay

## 2014-08-04 ENCOUNTER — Other Ambulatory Visit: Payer: Self-pay

## 2014-08-04 ENCOUNTER — Encounter: Payer: Self-pay | Admitting: Physical Therapy

## 2014-08-04 ENCOUNTER — Ambulatory Visit: Payer: No Typology Code available for payment source | Attending: Orthopedic Surgery | Admitting: Physical Therapy

## 2014-08-04 ENCOUNTER — Ambulatory Visit: Payer: No Typology Code available for payment source | Admitting: Physical Therapy

## 2014-08-04 DIAGNOSIS — R29898 Other symptoms and signs involving the musculoskeletal system: Secondary | ICD-10-CM | POA: Diagnosis present

## 2014-08-04 DIAGNOSIS — Z01818 Encounter for other preprocedural examination: Secondary | ICD-10-CM | POA: Diagnosis not present

## 2014-08-04 DIAGNOSIS — M259 Joint disorder, unspecified: Secondary | ICD-10-CM | POA: Diagnosis present

## 2014-08-04 DIAGNOSIS — M25673 Stiffness of unspecified ankle, not elsewhere classified: Secondary | ICD-10-CM

## 2014-08-04 DIAGNOSIS — M25572 Pain in left ankle and joints of left foot: Secondary | ICD-10-CM | POA: Diagnosis not present

## 2014-08-04 HISTORY — DX: Headache, unspecified: R51.9

## 2014-08-04 HISTORY — DX: Gastro-esophageal reflux disease without esophagitis: K21.9

## 2014-08-04 HISTORY — DX: Major depressive disorder, single episode, unspecified: F32.9

## 2014-08-04 HISTORY — DX: Personal history of other diseases of the digestive system: Z87.19

## 2014-08-04 HISTORY — DX: Unspecified asthma, uncomplicated: J45.909

## 2014-08-04 HISTORY — DX: Headache: R51

## 2014-08-04 HISTORY — DX: Depression, unspecified: F32.A

## 2014-08-04 LAB — BASIC METABOLIC PANEL
Anion gap: 4 — ABNORMAL LOW (ref 5–15)
BUN: 14 mg/dL (ref 6–20)
CALCIUM: 9.2 mg/dL (ref 8.9–10.3)
CO2: 22 mmol/L (ref 22–32)
Chloride: 114 mmol/L — ABNORMAL HIGH (ref 101–111)
Creatinine, Ser: 0.94 mg/dL (ref 0.44–1.00)
GFR calc Af Amer: >60 mL/min (ref >60)
GFR calc non Af Amer: >60 mL/min (ref >60)
GLUCOSE: 102 mg/dL — AB (ref 65–99)
POTASSIUM: 4.4 mmol/L (ref 3.5–5.1)
SODIUM: 140 mmol/L (ref 135–145)

## 2014-08-04 LAB — CBC
HEMATOCRIT: 36.3 % (ref 36.0–46.0)
HEMOGLOBIN: 12.2 g/dL (ref 12.0–15.0)
MCH: 30.9 pg (ref 26.0–34.0)
MCHC: 33.6 g/dL (ref 30.0–36.0)
MCV: 91.9 fL (ref 78.0–100.0)
PLATELETS: 223 10*3/uL (ref 150–400)
RBC: 3.95 MIL/uL (ref 3.87–5.11)
RDW: 13.6 % (ref 11.5–15.5)
WBC: 5.3 10*3/uL (ref 4.0–10.5)

## 2014-08-04 NOTE — Patient Instructions (Addendum)

## 2014-08-04 NOTE — Patient Instructions (Signed)
Your procedure is scheduled on:  August 20, 2014  Enter through the Main Entrance of [facility] at: [place] up the phone at the desk and dial [phone].  Call this number if you have problems the morning of surgery: [phone].  Remember: Do NOT eat food: after midnight on August 19, 2014 Do NOT drink clear liquids after: midnight on August 19, 2014 Take these medicines the morning of surgery with a SIP OF WATER: Topamax, Nexium, Inderal, Wellbutrin and Bring asthma inhaler day of surgery.   Do NOT wear jewelry (body piercing), metal hair clips/bobby pins, make-up, or nail polish. Do NOT wear lotions, powders, or perfumes.  You may wear deoderant. Do NOT shave for 48 hours prior to surgery. Do NOT bring valuables to the hospital. Contacts, dentures, or bridgework may not be worn into surgery. Leave suitcase in car.  After surgery it may be brought to your room.  For patients admitted to the hospital, checkout time is 11:00 AM the day of discharge.

## 2014-08-04 NOTE — Therapy (Signed)
[facility] [address], [place], [place], [address] Phone: [phone]   Fax:  [phone]  Physical Therapy Treatment  Patient Details  Name: Morgan Hooper MRN: [id number] Date of Birth: Aug 17, 1965 Referring Provider:  Tania Ade, MD  Encounter Date: 08/04/2014      PT End of Session - 08/04/14 1543    Visit Number 2  2nd treatment for ankle   Date for PT Re-Evaluation 09/04/14   PT Start Time 1525   PT Stop Time 1608   PT Time Calculation (min) 43 min   Activity Tolerance Patient tolerated treatment well   Behavior During Therapy [facility] for tasks assessed/performed      Past Medical History  Diagnosis Date  . Asthma     uses albuterol   . Depression     takes wellbutrin   . GERD (gastroesophageal reflux disease)     takes Nexium   . History of hiatal hernia   . Headache     takes topamax and Inderal     Past Surgical History  Procedure Laterality Date  . Cholecystectomy    . Tympanostomy tube placement      as a child   . Lymph node biopsy    . Removal of neck tumor    . Diagnostic laparoscopy      ectopic pregnancy took right fallopian tube in 1991  . Diagnostic laparoscopy      removal of cyst on right ovary   . Mandible reconstruction      There were no vitals filed for this visit.  Visit Diagnosis:  Left lateral ankle pain  Decreased ROM of ankle  Ankle weakness      Subjective Assessment - 08/04/14 1526    Subjective Left ankle feels sore, due to increased activity level this weekend   Limitations Standing;Walking;House hold activities   How long can you stand comfortably? difficult at end of day   How long can you walk comfortably? pain with stepping wrong   Patient Stated Goals decease pain in left ankle   Currently in Pain? No/denies  soreness, but declines pain                         [facility] Adult PT Treatment/Exercise - 08/04/14 0001    Exercises    Exercises Ankle;Knee/Hip   Knee/Hip Exercises: Standing   Lateral Step Up Left;3 sets;10 reps;Hand Hold: 0;Step Height: 6"  initially challenging improved with repetition    Forward Step Up 3 sets;10 reps;Hand Hold: 0;Step Height: 6"  quality and confidence improved with repetition   Iontophoresis   Type of Iontophoresis Dexamethasone   Location lateral left ankle   Dose #2  1 ml   Time 6hour   Manual Therapy   Manual Therapy Passive ROM;Soft tissue mobilization   Other Manual Therapy in prone to Lt achillestendon with PROM for elongation   Ankle Exercises: Aerobic   Stationary Bike L 1 63min   Ankle Exercises: Standing   SLS Lt LE x 4 with 20 sec hold  challenging compare to Rt LE   Rocker Board 2 minutes  DF/PF in standing; in sitting DF/PF & Inv/Ever each 2 min   Rebounder 3 directions x 1 min each                PT Education - 08/04/14 1610    Education provided Yes   Education Details Iontophoresis   Person(s) Educated Patient  Methods Explanation   Comprehension Verbalized understanding          PT Short Term Goals - 08/04/14 1529    PT SHORT TERM GOAL #1   Title left ankle pain with daily activities decreased >/= 25%   Time 3   Period Weeks   Status Achieved   PT SHORT TERM GOAL #2   Title independent with initial HEP    Time 3   Period Weeks   Status On-going   PT SHORT TERM GOAL #3   Title swelling in left ankle with figure 8 decreased >/= 0.5cm   Time 3   Period Weeks   Status On-going   PT SHORT TERM GOAL #4   Title --   Time --   Period --   Status --   PT SHORT TERM GOAL #5   Title --   Time --   Period --   Status --           PT Long Term Goals - 08/04/14 1530    PT LONG TERM GOAL #1   Title pain with daily activities decreased >/= 75%  50% improvement   Time 6   Period Weeks   Status On-going   PT LONG TERM GOAL #2   Title swelling in left ankle decreased >/= 1 cm   Time 6   Period Weeks   Status Achieved   PT  LONG TERM GOAL #3   Title left ankle strength 5/5 so she is able to perform work activities with minimal to no difficulty   Time 6   Period Weeks   Status On-going   PT LONG TERM GOAL #4   Title full left ankle AROM so patient can go down steps with a step over step pattern   Time 6   Period Weeks   Status On-going   PT LONG TERM GOAL #5   Title ambulate without a limp due to decreased left ankle pain  pt is wearing a soft brace to help with swelling and support   Time 6   Period Weeks   Status On-going               Plan - 08/04/14 1547    Clinical Impression Statement Pt has limited ROM Left ankle due to left ankle sprain 06/01/2014 during mud run   Pt will benefit from skilled therapeutic intervention in order to improve on the following deficits Abnormal gait;Decreased range of motion;Decreased endurance;Decreased activity tolerance;Decreased balance   Rehab Potential Excellent   PT Frequency 2x / week   PT Duration 6 weeks   PT Treatment/Interventions ADLs/Self Care Home Management;Gait training;Stair training;Ultrasound;Iontophoresis 4mg /ml Dexamethasone;Functional mobility training;Therapeutic activities;Therapeutic exercise;Balance training;Neuromuscular re-education;Manual techniques;Patient/family education   PT Next Visit Plan Continue to improve ROM Lt ankle, endurance, strength and balance improve gait   PT Home Exercise Plan establish HEP to Incr. ROM Lt ankle and strength   Consulted and Agree with Plan of Care Patient        Problem List There are no active problems to display for this patient.   NAUMANN-HOUEGNIFIO,Khloey Chern PTA 08/04/2014, 4:42 PM  [facility] [address] [place], [place], [address] Phone: [phone]   Fax:  [phone]

## 2014-08-05 ENCOUNTER — Encounter: Payer: Self-pay | Admitting: Physical Therapy

## 2014-08-07 ENCOUNTER — Ambulatory Visit: Payer: No Typology Code available for payment source | Admitting: Physical Therapy

## 2014-08-07 ENCOUNTER — Encounter: Payer: Self-pay | Admitting: Physical Therapy

## 2014-08-07 DIAGNOSIS — M25673 Stiffness of unspecified ankle, not elsewhere classified: Secondary | ICD-10-CM

## 2014-08-07 DIAGNOSIS — R29898 Other symptoms and signs involving the musculoskeletal system: Secondary | ICD-10-CM

## 2014-08-07 DIAGNOSIS — M25572 Pain in left ankle and joints of left foot: Secondary | ICD-10-CM

## 2014-08-07 NOTE — Therapy (Signed)
[facility] [address] [place], [place], [address] Phone: [phone]   Fax:  [phone]  Physical Therapy Treatment  Patient Details  Name: Morgan Hooper MRN: [id number] Date of Birth: [birth year] Referring Provider:  Tania Ade, MD  Encounter Date: 08/07/2014      PT End of Session - 08/07/14 1521    Visit Number 3   Date for PT Re-Evaluation 09/04/14   PT Start Time 4259   PT Stop Time 1525   PT Time Calculation (min) 40 min   Activity Tolerance Patient tolerated treatment well   Behavior During Therapy [facility] for tasks assessed/performed      Past Medical History  Diagnosis Date  . Asthma     uses albuterol   . Depression     takes wellbutrin   . GERD (gastroesophageal reflux disease)     takes Nexium   . History of hiatal hernia   . Headache     takes topamax and Inderal     Past Surgical History  Procedure Laterality Date  . Cholecystectomy    . Tympanostomy tube placement      as a child   . Lymph node biopsy    . Removal of neck tumor    . Diagnostic laparoscopy      ectopic pregnancy took right fallopian tube in 1991  . Diagnostic laparoscopy      removal of cyst on right ovary   . Mandible reconstruction      There were no vitals filed for this visit.  Visit Diagnosis:  Left lateral ankle pain  Ankle weakness  Decreased ROM of ankle      Subjective Assessment - 08/07/14 1450    Subjective I saw MD.  Wants me on light duty 08/20/2014.  I still have numbness on the 3rd and 4th toes.  MD said the nerves were bruised when she fell. I had pain when walking on uneven graoud.    Limitations Standing;Walking;House hold activities   How long can you stand comfortably? difficult at end of day   How long can you walk comfortably? pain with stepping wrong   Patient Stated Goals decease pain in left ankle   Currently in Pain? Yes   Pain Score 6    Pain Location Ankle   Pain  Orientation Left   Pain Descriptors / Indicators Aching;Sharp   Pain Type Acute pain   Pain Onset 1 to 4 weeks ago   Pain Frequency Intermittent   Aggravating Factors  weightbearing    Pain Relieving Factors elevate   Effect of Pain on Daily Activities walking   Multiple Pain Sites No            [facility] PT Assessment - 08/07/14 0001    Strength   Left Ankle Dorsiflexion 3+/5   Left Ankle Plantar Flexion 4/5   Left Ankle Inversion 3+/5   Left Ankle Eversion 4/5                     [facility] Adult PT Treatment/Exercise - 08/07/14 0001    Lumbar Exercises: Aerobic   Stationary Bike level 0 x 5 min   Knee/Hip Exercises: Standing   Step Down Right;15 reps;Step Height: 4"  left 15 reps step height 4" one hand slight pain in left   Step Down Limitations no pain   Iontophoresis   Type of Iontophoresis Dexamethasone   Location lateral left ankle   Dose #3 1 ml  Time 6hour   Ankle Exercises: Standing   BAPS Standing;Level 1;15 reps  right foot on floor; therapist hold left knee still   Rebounder 3 directions x 1 min each  no hand holding                PT Education - 08/07/14 1519    Education provided Yes   Education Details ankle theraband exercises   Person(s) Educated Patient   Methods Explanation;Demonstration;Tactile cues;Verbal cues   Comprehension Verbalized understanding;Returned demonstration          PT Short Term Goals - 08/04/14 1529    PT SHORT TERM GOAL #1   Title left ankle pain with daily activities decreased >/= 25%   Time 3   Period Weeks   Status Achieved   PT SHORT TERM GOAL #2   Title independent with initial HEP    Time 3   Period Weeks   Status On-going   PT SHORT TERM GOAL #3   Title swelling in left ankle with figure 8 decreased >/= 0.5cm   Time 3   Period Weeks   Status On-going   PT SHORT TERM GOAL #4   Title --   Time --   Period --   Status --   PT SHORT TERM GOAL #5   Title --   Time --   Period --    Status --           PT Long Term Goals - 08/04/14 1530    PT LONG TERM GOAL #1   Title pain with daily activities decreased >/= 75%  50% improvement   Time 6   Period Weeks   Status On-going   PT LONG TERM GOAL #2   Title swelling in left ankle decreased >/= 1 cm   Time 6   Period Weeks   Status Achieved   PT LONG TERM GOAL #3   Title left ankle strength 5/5 so she is able to perform work activities with minimal to no difficulty   Time 6   Period Weeks   Status On-going   PT LONG TERM GOAL #4   Title full left ankle AROM so patient can go down steps with a step over step pattern   Time 6   Period Weeks   Status On-going   PT LONG TERM GOAL #5   Title ambulate without a limp due to decreased left ankle pain  pt is wearing a soft brace to help with swelling and support   Time 6   Period Weeks   Status On-going               Plan - 08/07/14 1524    Clinical Impression Statement Patient continues to have some swelling in lateral left ankle.  Patient has difficulty with going down 4" step with left foot on step and not able to do with a 6 " step.  Patient has decreased pain in left ankle.  Patient has theraband  exercises for home to strengthen her left ankle.    Pt will benefit from skilled therapeutic intervention in order to improve on the following deficits Abnormal gait;Decreased range of motion;Decreased endurance;Decreased activity tolerance;Decreased balance   Rehab Potential Excellent   Clinical Impairments Affecting Rehab Potential None   PT Frequency 2x / week   PT Duration 6 weeks   PT Treatment/Interventions ADLs/Self Care Home Management;Gait training;Stair training;Ultrasound;Iontophoresis 4mg /ml Dexamethasone;Functional mobility training;Therapeutic activities;Therapeutic exercise;Balance training;Neuromuscular re-education;Manual techniques;Patient/family education   PT Next Visit Plan  Continue to improve ROM Lt ankle, endurance, strength and balance  improve gait   PT Home Exercise Plan heel raises single leg stance, tandem walk   Consulted and Agree with Plan of Care Patient        Problem List There are no active problems to display for this patient.   Eudelia Hiltunen,PT 08/07/2014, 3:27 PM  [facility] [address], [place], [place], [address] Phone: [phone]   Fax:  [phone]

## 2014-08-07 NOTE — Patient Instructions (Signed)
Eversion: Resisted   With right foot in tubing loop, hold tubing around other foot to resist and turn foot out. Repeat _30___ times per set. Do __1__ sets per session. Do _2___ sessions per day. Red band [email or web address]://[email or web address]   Copyright  VHI. All rights reserved.  Inversion: Resisted   Cross legs with right leg underneath, foot in tubing loop. Hold tubing around other foot to resist and turn foot in. Repeat __30__ times per set. Do __1__ sets per session. Do _2___ sessions per day. Red band [email or web address]://[email or web address]   Copyright  VHI. All rights reserved.  Plantar Flexion: Resisted   Anchor behind, tubing around left foot, press down. Repeat __30__ times per set. Do _1__ sets per session. Do _1___ sessions per day. Morgan Hooper band [email or web address]   Copyright  VHI. All rights reserved.

## 2014-08-12 ENCOUNTER — Encounter: Payer: Self-pay | Admitting: Physical Therapy

## 2014-08-12 ENCOUNTER — Ambulatory Visit: Payer: No Typology Code available for payment source | Admitting: Physical Therapy

## 2014-08-12 DIAGNOSIS — M25572 Pain in left ankle and joints of left foot: Secondary | ICD-10-CM | POA: Diagnosis not present

## 2014-08-12 DIAGNOSIS — R29898 Other symptoms and signs involving the musculoskeletal system: Secondary | ICD-10-CM

## 2014-08-12 NOTE — Therapy (Signed)
[facility] [address] [place], [place], [address] Phone: [phone]   Fax:  [phone]  Physical Therapy Treatment  Patient Details  Name: Morgan Hooper MRN: [id number] Date of Birth: [birth year] Referring Provider:  Tania Ade, MD  Encounter Date: 08/12/2014      PT End of Session - 08/12/14 1612    Visit Number 4   Date for PT Re-Evaluation 09/04/14   PT Start Time 3704   PT Stop Time 1445   PT Time Calculation (min) 1395 min   Activity Tolerance Patient tolerated treatment well   Behavior During Therapy [facility] for tasks assessed/performed      Past Medical History  Diagnosis Date  . Asthma     uses albuterol   . Depression     takes wellbutrin   . GERD (gastroesophageal reflux disease)     takes Nexium   . History of hiatal hernia   . Headache     takes topamax and Inderal     Past Surgical History  Procedure Laterality Date  . Cholecystectomy    . Tympanostomy tube placement      as a child   . Lymph node biopsy    . Removal of neck tumor    . Diagnostic laparoscopy      ectopic pregnancy took right fallopian tube in 1991  . Diagnostic laparoscopy      removal of cyst on right ovary   . Mandible reconstruction      There were no vitals filed for this visit.  Visit Diagnosis:  Left lateral ankle pain  Ankle weakness      Subjective Assessment - 08/12/14 1539    Subjective I have one more visit.  The numbness has subsided.  The 4th toe on my left foot does not bend. I had touble with walking on uneven ground with flip flops.  I worked in the [place] with no difficulty and get out with the good leg first. 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 Patient can do right leg  in front  with going down steps with slight difficulty but no pain.    Limitations Standing;Walking;House hold activities   How long can you stand comfortably? difficult at end of day   How  long can you walk comfortably? pain with stepping wrong   Patient Stated Goals decease pain in left ankle   Currently in Pain? Yes   Pain Score 2    Pain Location Ankle   Pain Orientation Left   Pain Descriptors / Indicators Aching   Pain Type Acute pain   Pain Onset More than a month ago   Pain Frequency Intermittent   Aggravating Factors  walking out in yard on uneven ground.    Pain Relieving Factors elevate   Effect of Pain on Daily Activities walking   Multiple Pain Sites No            [facility] PT Assessment - 08/12/14 0001    Figure 8 Edema   Figure 8 - Left  47.2cm                     [facility] Adult PT Treatment/Exercise - 08/12/14 0001    Lumbar Exercises: Aerobic   Stationary Bike level 0 x 5 min   Manual Therapy   Manual Therapy Soft tissue mobilization;Joint mobilization;Passive ROM   Joint Mobilization distraction, ant/post glide of left ankle grade 3,  distration to cuboid bone and Talus    Soft tissue mobilization to left foot and achilles tendon   Ankle Exercises: Standing   BAPS Standing;Level 2;15 reps  right foot on floor; therapist hold left knee still   Rebounder 3 directions x 1 min each  no hand holding   Other Standing Ankle Exercises Tandem stance hold 30 seconds with left foot behind x 3                  PT Short Term Goals - 08/12/14 1547    PT SHORT TERM GOAL #2   Title independent with initial HEP    Time 3   Period Weeks   Status Achieved   PT SHORT TERM GOAL #3   Title swelling in left ankle with figure 8 decreased >/= 0.5cm   Time 3   Period Weeks   Status Achieved           PT Long Term Goals - 08/04/14 1530    PT LONG TERM GOAL #1   Title pain with daily activities decreased >/= 75%  50% improvement   Time 6   Period Weeks   Status On-going   PT LONG TERM GOAL #2   Title swelling in left ankle decreased >/= 1 cm   Time 6   Period Weeks   Status Achieved   PT LONG TERM GOAL #3   Title left ankle  strength 5/5 so she is able to perform work activities with minimal to no difficulty   Time 6   Period Weeks   Status On-going   PT LONG TERM GOAL #4   Title full left ankle AROM so patient can go down steps with a step over step pattern   Time 6   Period Weeks   Status On-going   PT LONG TERM GOAL #5   Title ambulate without a limp due to decreased left ankle pain  pt is wearing a soft brace to help with swelling and support   Time 6   Period Weeks   Status On-going               Plan - 08/12/14 1612    Clinical Impression Statement Patient has met all of her STG's.  Patient has decreased swelling in left ankle by 2 cm.  Patient is not able to do single leg heel raise on left due to pain.  Patieng had decreased mobility of left foot bones.  After joint mobilization she was able to bend her 4th toe and had decreased pain. Patient will have 1 more visit then will be discharged due to having a hysterectomy.    Pt will benefit from skilled therapeutic intervention in order to improve on the following deficits Abnormal gait;Decreased range of motion;Decreased endurance;Decreased activity tolerance;Decreased balance   Rehab Potential Excellent   Clinical Impairments Affecting Rehab Potential None   PT Frequency 2x / week   PT Duration 6 weeks   PT Treatment/Interventions ADLs/Self Care Home Management;Gait training;Stair training;Ultrasound;Iontophoresis 4mg /ml Dexamethasone;Functional mobility training;Therapeutic activities;Therapeutic exercise;Balance training;Neuromuscular re-education;Manual techniques;Patient/family education   PT Next Visit Plan soft tissue work, discharge, FOTO, review HEP   PT Home Exercise Plan current HEP   Consulted and Agree with Plan of Care Patient        Problem List There are no active problems to display for this patient.   GRAY,CHERYL,PT 08/12/2014, 4:15 PM  [facility] [address], [place], [place], [address]  Phone: [phone]   Fax:  [phone]

## 2014-08-17 NOTE — H&P (Signed)
Morgan Hooper is 49yo postmenopausal female who presents for LAVH, BSO due to chronic pelvic pain. In review, she reports several months of intermittent crampy sometimes sharp, non-radiating 10/10 pelvic pain. Usually occurs following intercourse, but also with activity (like bending down). Denies vaginal dryness, the dyspareunia is during and several minutes following IC. Pain improves after prolonged rest and is greatly impacting her day to day. She has tried OTC medications with minimal and is currently undergoing PT. While the PT has helped, she is still reporting significant dyspareunia. [place] report reviewed with patient: Anteverted uterus measuring 5.3x2.6x2.7cm, normal size and shape. Small fundal fibroid ~ 1cm in size, thin endometrium. No adnexal or pelvic masses. Both ovaries unremarkable. Pt underwent endometrial ablation several years ago for management of HMB. She did have some spotting for a few years, but reports no vaginal bleeding for well over 2 years. She reports a very occasional leak of urine with coughing or sneezing, but this has actually improved following PT. She also notes vasomotor symptoms which we have discussed and will address following surgical management of her chronic pelvic pain.    Gyn History:  Sexual activity currently sexually active.  Periods : no period with endometrial ablation.  LMP 03/18/2010.  Birth control BTL.  Last pap smear date 05/30/14.  Last mammogram date 06/2014.  Abnormal pap smear 08/18/11,assessed with colposcopy, treated with cryo,07/14/11, ASCUS, High Risk HPV+.  STD HPV.  Menarche 31.  GYN procedures uterine ablation, over 12 years ago.        OB History:  Number of pregnancies 3.  Pregnancy # 1 live birth, vaginal delivery.  Pregnancy # 2 ectopic.  Pregnancy # 3 live birth, vaginal delivery.     Past Medical History  Diagnosis Date  . Asthma     uses albuterol   . Depression     takes wellbutrin   . GERD (gastroesophageal reflux  disease)     takes Nexium   . History of hiatal hernia   . Headache     takes topamax and Inderal     Past Surgical History  Procedure Laterality Date  . Cholecystectomy    . Tympanostomy tube placement      as a child   . Lymph node biopsy    . Removal of neck tumor    . Diagnostic laparoscopy      ectopic pregnancy took right fallopian tube in 1991  . Diagnostic laparoscopy      removal of cyst on right ovary   . Mandible reconstruction      Social History:  reports that she has never smoked. She has never used smokeless tobacco. She reports that she drinks alcohol. Her drug history is not on file.  Allergies:  Allergies  Allergen Reactions  . Augmentin [Amoxicillin-Pot Clavulanate] Nausea Only  . Lotrel [Amlodipine Besy-Benazepril Hcl] Cough  . Paxil [Paroxetine Hcl] Other (See Comments)    Causes daze feeling  . Prednisone Other (See Comments)    Cannot tolerate oral but injection is ok  . Bactrim [Sulfamethoxazole-Trimethoprim] Rash  . Keflex [Cephalexin] Rash  . Levaquin [Levofloxacin] Rash   Medications: Wellbutrin XL(BuPROPion HCl ER) 300 MG Tablet Extended Release 24 Hour 1 tablet every morning Once a day,  Nexium(Esomeprazole Magnesium) 40 mg Capsule Delayed Release 1 capsule qd on an empty stomach,  Atorvastatin Calcium 40 MG Tablet 1 tablet Once a day,  Xanax(ALPRAZolam) 1 MG Tablet 1 tablet twice a day as needed,  Topiramate 100MG  Tablet TAKE 2 TABLETS  TWICE A DAY as directed,  Propranolol HCl 60 MG Tablet 1 tablet Twice a day  ROS   CONSTITUTIONAL:  no Chills. no Fever. no Night sweats.  SKIN:  no Rash. no Hives.  HEENT:  Blurrred vision no. no Double vision.  CARDIOLOGY:  no Chest pain.  RESPIRATORY:  no Shortness of breath. no Cough.  UROLOGY:  no Urinary frequency. no Urinary incontinence. no Urinary urgency.  FEMALE REPRODUCTIVE:  no Breast lumps or discharge. no Breast pain.  NEUROLOGY:  no Dizziness. no Headache. no Loss of  consciousness.  PSYCHOLOGY:  no Depression. no Confusion.  HEMATOLOGY/LYMPH:  no Anemia. no Fatigue. Using Blood Thinners no.      Physical Exam Performed in office 08/14/14 Vitals: Wt 157, Wt change -2 lb, Ht 61.75, BMI 28.95, Temp 98.5, Pulse sitting 81, BP sitting 120/80.      Examination:  General Examination:  GENERAL APPEARANCE alert, oriented, NAD, pleasant.  SKIN: normal, no rash.  NECK: supple, normal appearance.  BREASTS: no palpable masses bilaterally, no nipple discharge, no lymphadenopathy bilaterally.  LUNGS: clear to auscultation bilaterally, no wheezes, rhonchi, rales.  HEART: no murmurs, regular rate and rhythm.  ABDOMEN: no masses palpated, soft and not tender, no rebound, no guarding.  FEMALE GENITOURINARY: No external lesions, Vagina - pink moist mucosa, no lesions or abnormal discharge, cervix - visualized, no discharge or lesions. Stage I uterine prolapse, no cystocele or rectocele noted, normal urethra, +tenderness/discomfort with uterine manipulation and palpation of cervix, No adnexal masses bilaterally. Uterus: normal size on palpation.  EXTREMITIES: no edema present, no calf tenderness bilaterally.     Assessment/Plan: 49yo postmenopausal female who presents for LAVH, BSO due to pelvic pain and uterine prolapse -NPO -LR @ 125cc/hr -SCDs to OR -Gent/Clinda to OR -Reviewed risk, benefit and indications of surgery including but not limited to risk of bleeding, infection and injury to surrounding organs. Discussed with patient that while it is likely her pain is gynecologic in nature there is no guarentee that hsyterectomy will cure her symptoms. Also discussed oophorectomy- reviewed risk and benefit of removal- decreased risk of ovarian cancer vs potential benefit of ovarian preservation (heart, bone and brain health). Pt aware and wishes to proceed with removal as she has already gone through menopause and doesn't want to risk requiring more surgery in the  future.    Morgan Hooper, M 08/17/2014, 7:46 AM

## 2014-08-19 ENCOUNTER — Encounter: Payer: Self-pay | Admitting: Physical Therapy

## 2014-08-19 ENCOUNTER — Ambulatory Visit: Payer: No Typology Code available for payment source | Admitting: Physical Therapy

## 2014-08-19 DIAGNOSIS — M25572 Pain in left ankle and joints of left foot: Secondary | ICD-10-CM | POA: Diagnosis not present

## 2014-08-19 DIAGNOSIS — R29898 Other symptoms and signs involving the musculoskeletal system: Secondary | ICD-10-CM

## 2014-08-19 DIAGNOSIS — M25673 Stiffness of unspecified ankle, not elsewhere classified: Secondary | ICD-10-CM

## 2014-08-19 MED ORDER — GENTAMICIN SULFATE 40 MG/ML IJ SOLN
INTRAVENOUS | Status: AC
  Administered 2014-08-20: 100 mL via INTRAVENOUS
  Filled 2014-08-19: qty 7.25

## 2014-08-19 NOTE — Therapy (Signed)
[facility] [address], [place], [place], [address] Phone: [phone]   Fax:  [phone]  Physical Therapy Treatment  Patient Details  Name: Morgan Hooper MRN: [id number] Date of Birth: 01-27-1966 Referring Provider:  Tania Ade, MD  Encounter Date: 08/19/2014      PT End of Session - 08/19/14 1612    Visit Number 5   Date for PT Re-Evaluation 09/04/14   PT Start Time 8127   PT Stop Time 1650   PT Time Calculation (min) 40 min   Activity Tolerance Patient tolerated treatment well   Behavior During Therapy [facility] for tasks assessed/performed      Past Medical History  Diagnosis Date  . Asthma     uses albuterol   . Depression     takes wellbutrin   . GERD (gastroesophageal reflux disease)     takes Nexium   . History of hiatal hernia   . Headache     takes topamax and Inderal     Past Surgical History  Procedure Laterality Date  . Cholecystectomy    . Tympanostomy tube placement      as a child   . Lymph node biopsy    . Removal of neck tumor    . Diagnostic laparoscopy      ectopic pregnancy took right fallopian tube in 1991  . Diagnostic laparoscopy      removal of cyst on right ovary   . Mandible reconstruction      There were no vitals filed for this visit.  Visit Diagnosis:  Left lateral ankle pain  Ankle weakness  Decreased ROM of ankle      Subjective Assessment - 08/19/14 1623    Subjective My left ankle is very sore due to doing alot yesterday.  I will be having a complete hysterectomy tomorrow.    How long can you stand comfortably? difficult at end of day   How long can you walk comfortably? pain with stepping wrong   Patient Stated Goals decease pain in left ankle   Currently in Pain? Yes   Pain Score 2    Pain Location Ankle   Pain Orientation Left   Pain Descriptors / Indicators Aching   Pain Type Acute pain   Pain Onset More than a month ago   Pain  Frequency Intermittent   Aggravating Factors  walking, steps, going up and down ladders   Pain Relieving Factors elevate   Effect of Pain on Daily Activities walking   Multiple Pain Sites No            [facility] PT Assessment - 08/19/14 0001    Assessment   Medical Diagnosis left lateral ankle pain   Onset Date/Surgical Date 06/28/14   Prior Function   Level of Independence Independent with basic ADLs   Observation/Other Assessments   Focus on Therapeutic Outcomes (FOTO)  53% limitation   Figure 8 Edema   Figure 8 - Left  47.2cm   AROM   Overall AROM  Within functional limits for tasks performed   PROM   Overall PROM  Within functional limits for tasks performed   Strength   Left Ankle Dorsiflexion 5/5   Left Ankle Plantar Flexion 3/5   Left Ankle Inversion 5/5   Left Ankle Eversion 5/5   Palpation   Palpation comment Tenderness located in anterior medial and lateral mallelous  [facility] Adult PT Treatment/Exercise - 08/19/14 0001    Lumbar Exercises: Aerobic   Stationary Bike level 0 x 5 min   Manual Therapy   Manual Therapy Soft tissue mobilization;Joint mobilization;Passive ROM   Joint Mobilization distraction, ant/post glide of left ankle grade 3, distration to cuboid bone and Talus    Soft tissue mobilization to left foot and achilles tendon; left foot and around malleolus                 PT Education - 08/19/14 1647    Education provided Yes   Education Details reviewed HEP and patient is independent, patient understands to start exercising when MD gives her orders after her surgery.   Person(s) Educated Patient   Methods Explanation;Demonstration   Comprehension Verbalized understanding;Returned demonstration          PT Short Term Goals - 08/19/14 1649    PT SHORT TERM GOAL #5   Title ---           PT Long Term Goals - 08/19/14 1650    PT LONG TERM GOAL #1   Title pain with daily activities decreased >/= 75%   50% improvement   Time 6   Period Weeks   Status Not Met   PT LONG TERM GOAL #2   Title swelling in left ankle decreased >/= 1 cm   Time 6   Period Weeks   Status Achieved   PT LONG TERM GOAL #3   Title left ankle strength 5/5 so she is able to perform work activities with minimal to no difficulty   Time 6   Period Weeks   Status Not Met  plantarflexion 3/5   PT LONG TERM GOAL #4   Title full left ankle AROM so patient can go down steps with a step over step pattern   Time 6   Period Weeks   Status Achieved   PT LONG TERM GOAL #5   Title ambulate without a limp due to decreased left ankle pain   Time 6   Period Weeks   Status Not Met  slight limp due to pain on left ankle               Plan - 08/19/14 1652    Clinical Impression Statement Patient is a 49 year old female with diagnosis of left lateral ankle sprain on 06/28/2014 due to falling during a mud run.  Patient swelling in her left ankle has decreased by 2 cm.  Patient left ankle pain is 2/10 due to going up and down a ladder and oragnizing her home. Left ankle strenght is 5/5 with exception of plantarflexion 3/5.  Patient has full ROM of left ankle.  Patient reports numbness in left foot especially after she does too much.  FOTO score is 53% limitation. Patient is having a complete hysterectomy tomorrow so she is being discharged due to change in medical status.    Pt will benefit from skilled therapeutic intervention in order to improve on the following deficits Abnormal gait;Decreased range of motion;Decreased endurance;Decreased activity tolerance;Decreased balance   Rehab Potential Excellent   Clinical Impairments Affecting Rehab Potential None   PT Treatment/Interventions ADLs/Self Care Home Management;Gait training;Stair training;Ultrasound;Iontophoresis 83m/ml Dexamethasone;Functional mobility training;Therapeutic activities;Therapeutic exercise;Balance training;Neuromuscular re-education;Manual  techniques;Patient/family education   PT Next Visit Plan Discharge to HEP due to having a hysterectomy tomorrow.    PT Home Exercise Plan current HEP   Recommended Other Services None   Consulted and Agree with Plan of  Care Patient        Problem List There are no active problems to display for this patient.   GRAY,CHERYL 08/19/2014, 4:59 PM  [facility] [address], [place], [place], [address] Phone: [phone]   Fax:  [phone]     PHYSICAL THERAPY DISCHARGE SUMMARY  Visits from Start of Care: 5 Current functional level related to goals / functional outcomes: See above   Remaining deficits: See above.  Patient still has numbness in left foot.  Patient is having a complete hysterectomy on 06/20/2014 therefore she will be discharged.    Education / Equipment: HEP  Plan: Patient agrees to discharge.  Patient goals were partially met. Patient is being discharged due to a change in medical status. Thank you  For the referral. Earlie Counts, PT 08/19/2014 4:59 PM   ?????

## 2014-08-20 ENCOUNTER — Ambulatory Visit (HOSPITAL_COMMUNITY): Payer: No Typology Code available for payment source | Admitting: Anesthesiology

## 2014-08-20 ENCOUNTER — Encounter (HOSPITAL_COMMUNITY)
Admission: RE | Disposition: A | Discharge status: Home / Self Care | Payer: Self-pay | Source: Ambulatory Visit | Attending: Obstetrics & Gynecology

## 2014-08-20 ENCOUNTER — Encounter (HOSPITAL_COMMUNITY): Payer: Self-pay | Admitting: Certified Registered Nurse Anesthetist

## 2014-08-20 ENCOUNTER — Observation Stay (HOSPITAL_COMMUNITY)
Admission: RE | Admit: 2014-08-20 | Discharge: 2014-08-21 | Disposition: A | Discharge status: Home / Self Care | Payer: No Typology Code available for payment source | Source: Ambulatory Visit | Attending: Obstetrics & Gynecology | Admitting: Obstetrics & Gynecology

## 2014-08-20 DIAGNOSIS — R51 Headache: Secondary | ICD-10-CM | POA: Insufficient documentation

## 2014-08-20 DIAGNOSIS — Z888 Allergy status to other drugs, medicaments and biological substances status: Secondary | ICD-10-CM | POA: Insufficient documentation

## 2014-08-20 DIAGNOSIS — J45909 Unspecified asthma, uncomplicated: Secondary | ICD-10-CM | POA: Insufficient documentation

## 2014-08-20 DIAGNOSIS — D259 Leiomyoma of uterus, unspecified: Secondary | ICD-10-CM | POA: Insufficient documentation

## 2014-08-20 DIAGNOSIS — K219 Gastro-esophageal reflux disease without esophagitis: Secondary | ICD-10-CM | POA: Diagnosis not present

## 2014-08-20 DIAGNOSIS — Z881 Allergy status to other antibiotic agents status: Secondary | ICD-10-CM | POA: Diagnosis not present

## 2014-08-20 DIAGNOSIS — N814 Uterovaginal prolapse, unspecified: Secondary | ICD-10-CM | POA: Insufficient documentation

## 2014-08-20 DIAGNOSIS — N800 Endometriosis of uterus: Secondary | ICD-10-CM | POA: Insufficient documentation

## 2014-08-20 DIAGNOSIS — R102 Pelvic and perineal pain: Principal | ICD-10-CM | POA: Insufficient documentation

## 2014-08-20 DIAGNOSIS — N941 Dyspareunia: Secondary | ICD-10-CM | POA: Diagnosis not present

## 2014-08-20 DIAGNOSIS — F329 Major depressive disorder, single episode, unspecified: Secondary | ICD-10-CM | POA: Insufficient documentation

## 2014-08-20 DIAGNOSIS — G8929 Other chronic pain: Secondary | ICD-10-CM | POA: Diagnosis present

## 2014-08-20 HISTORY — PX: UNILATERAL SALPINGECTOMY: SHX6160

## 2014-08-20 HISTORY — PX: LAPAROSCOPIC SALPINGO OOPHERECTOMY: SHX5927

## 2014-08-20 HISTORY — PX: LAPAROSCOPIC ASSISTED VAGINAL HYSTERECTOMY: SHX5398

## 2014-08-20 LAB — ABO/RH: ABO/RH(D): A NEG

## 2014-08-20 LAB — TYPE AND SCREEN
ABO/RH(D): A NEG
Antibody Screen: NEGATIVE

## 2014-08-20 SURGERY — HYSTERECTOMY, VAGINAL, LAPAROSCOPY-ASSISTED
Anesthesia: General | Site: Vagina

## 2014-08-20 MED ORDER — EPHEDRINE 5 MG/ML INJ
INTRAVENOUS | Status: AC
  Filled 2014-08-20: qty 10

## 2014-08-20 MED ORDER — ONDANSETRON HCL 4 MG/2ML IJ SOLN: 4.0000 mg | Freq: Four times a day (QID) | INTRAMUSCULAR | Status: DC | PRN

## 2014-08-20 MED ORDER — LACTATED RINGERS IV SOLN
INTRAVENOUS | Status: DC
  Administered 2014-08-20 (×3): via INTRAVENOUS

## 2014-08-20 MED ORDER — KETOROLAC TROMETHAMINE 30 MG/ML IJ SOLN
INTRAMUSCULAR | Status: AC
  Filled 2014-08-20: qty 1

## 2014-08-20 MED ORDER — BUPIVACAINE HCL (PF) 0.25 % IJ SOLN
INTRAMUSCULAR | Status: AC
  Filled 2014-08-20: qty 30

## 2014-08-20 MED ORDER — ACETAMINOPHEN 10 MG/ML IV SOLN
1000.0000 mg | Freq: Once | INTRAVENOUS | Status: AC
  Administered 2014-08-20: 1000 mg via INTRAVENOUS
  Filled 2014-08-20: qty 100

## 2014-08-20 MED ORDER — DOCUSATE SODIUM 100 MG PO CAPS
100.0000 mg | ORAL_CAPSULE | Freq: Two times a day (BID) | ORAL | Status: DC
  Administered 2014-08-20 – 2014-08-21 (×2): 100 mg via ORAL
  Filled 2014-08-20 (×2): qty 1

## 2014-08-20 MED ORDER — LIDOCAINE HCL (PF) 1 % IJ SOLN
INTRAMUSCULAR | Status: AC
  Filled 2014-08-20: qty 5

## 2014-08-20 MED ORDER — KETOROLAC TROMETHAMINE 30 MG/ML IJ SOLN: 30.0000 mg | Freq: Once | INTRAMUSCULAR | Status: DC

## 2014-08-20 MED ORDER — SIMETHICONE 80 MG PO CHEW: 80.0000 mg | CHEWABLE_TABLET | Freq: Four times a day (QID) | ORAL | Status: DC | PRN

## 2014-08-20 MED ORDER — LACTATED RINGERS IV SOLN
INTRAVENOUS | Status: DC
  Administered 2014-08-20 – 2014-08-21 (×3): via INTRAVENOUS

## 2014-08-20 MED ORDER — LIDOCAINE HCL (CARDIAC) 20 MG/ML IV SOLN
INTRAVENOUS | Status: DC | PRN
  Administered 2014-08-20: 30 mg via INTRAVENOUS

## 2014-08-20 MED ORDER — LIDOCAINE-EPINEPHRINE 1 %-1:100000 IJ SOLN
INTRAMUSCULAR | Status: DC | PRN
  Administered 2014-08-20: 18 mL

## 2014-08-20 MED ORDER — NEOSTIGMINE METHYLSULFATE 10 MG/10ML IV SOLN
INTRAVENOUS | Status: DC | PRN
Start: 2014-08-20 — End: 2014-08-20
  Administered 2014-08-20: 3 mg via INTRAVENOUS

## 2014-08-20 MED ORDER — BUPIVACAINE HCL (PF) 0.25 % IJ SOLN
INTRAMUSCULAR | Status: DC | PRN
  Administered 2014-08-20: 20 mL

## 2014-08-20 MED ORDER — SODIUM CHLORIDE 0.9 % IJ SOLN
INTRAMUSCULAR | Status: AC
  Filled 2014-08-20: qty 100

## 2014-08-20 MED ORDER — ONDANSETRON HCL 4 MG/2ML IJ SOLN
INTRAMUSCULAR | Status: DC | PRN
  Administered 2014-08-20: 4 mg via INTRAVENOUS

## 2014-08-20 MED ORDER — PROPOFOL 10 MG/ML IV BOLUS
INTRAVENOUS | Status: AC
  Filled 2014-08-20: qty 20

## 2014-08-20 MED ORDER — LACTATED RINGERS IV SOLN: INTRAVENOUS | Status: DC

## 2014-08-20 MED ORDER — PANTOPRAZOLE SODIUM 40 MG IV SOLR
40.0000 mg | Freq: Every day | INTRAVENOUS | Status: DC
  Administered 2014-08-20: 40 mg via INTRAVENOUS
  Filled 2014-08-20: qty 40

## 2014-08-20 MED ORDER — FENTANYL CITRATE (PF) 100 MCG/2ML IJ SOLN
INTRAMUSCULAR | Status: DC | PRN
  Administered 2014-08-20 (×2): 100 ug via INTRAVENOUS
  Administered 2014-08-20: 50 ug via INTRAVENOUS

## 2014-08-20 MED ORDER — SCOPOLAMINE 1 MG/3DAYS TD PT72
MEDICATED_PATCH | TRANSDERMAL | Status: AC
  Administered 2014-08-20: 1.5 mg via TRANSDERMAL
  Filled 2014-08-20: qty 1

## 2014-08-20 MED ORDER — OXYCODONE-ACETAMINOPHEN 5-325 MG PO TABS
1.0000 | ORAL_TABLET | ORAL | Status: DC | PRN
  Administered 2014-08-21 (×2): 1 via ORAL
  Filled 2014-08-20 (×2): qty 1

## 2014-08-20 MED ORDER — ALPRAZOLAM 0.5 MG PO TABS: 1.0000 mg | ORAL_TABLET | Freq: Two times a day (BID) | ORAL | Status: DC | PRN

## 2014-08-20 MED ORDER — KETOROLAC TROMETHAMINE 30 MG/ML IJ SOLN
INTRAMUSCULAR | Status: DC | PRN
  Administered 2014-08-20: 30 mg via INTRAVENOUS

## 2014-08-20 MED ORDER — LIDOCAINE-EPINEPHRINE 1 %-1:100000 IJ SOLN
INTRAMUSCULAR | Status: AC
  Filled 2014-08-20: qty 1

## 2014-08-20 MED ORDER — GLYCOPYRROLATE 0.2 MG/ML IJ SOLN
INTRAMUSCULAR | Status: AC
  Filled 2014-08-20: qty 1

## 2014-08-20 MED ORDER — NEOSTIGMINE METHYLSULFATE 10 MG/10ML IV SOLN
INTRAVENOUS | Status: AC
  Filled 2014-08-20: qty 1

## 2014-08-20 MED ORDER — METOCLOPRAMIDE HCL 5 MG/ML IJ SOLN
10.0000 mg | Freq: Once | INTRAMUSCULAR | Status: AC
  Administered 2014-08-20: 10 mg via INTRAVENOUS

## 2014-08-20 MED ORDER — PROPOFOL 10 MG/ML IV BOLUS
INTRAVENOUS | Status: DC | PRN
  Administered 2014-08-20: 200 mg via INTRAVENOUS

## 2014-08-20 MED ORDER — FENTANYL CITRATE (PF) 100 MCG/2ML IJ SOLN: 25.0000 ug | INTRAMUSCULAR | Status: DC | PRN

## 2014-08-20 MED ORDER — TOPIRAMATE 100 MG PO TABS
200.0000 mg | ORAL_TABLET | Freq: Two times a day (BID) | ORAL | Status: DC
  Administered 2014-08-21: 200 mg via ORAL
  Filled 2014-08-20 (×3): qty 2

## 2014-08-20 MED ORDER — HYDROMORPHONE HCL 1 MG/ML IJ SOLN
0.2000 mg | INTRAMUSCULAR | Status: DC | PRN
  Administered 2014-08-20 – 2014-08-21 (×2): 0.6 mg via INTRAVENOUS
  Filled 2014-08-20 (×2): qty 1

## 2014-08-20 MED ORDER — METOCLOPRAMIDE HCL 5 MG/ML IJ SOLN
INTRAMUSCULAR | Status: AC
  Administered 2014-08-20: 10 mg via INTRAVENOUS
  Filled 2014-08-20: qty 2

## 2014-08-20 MED ORDER — MIDAZOLAM HCL 2 MG/2ML IJ SOLN
INTRAMUSCULAR | Status: AC
  Filled 2014-08-20: qty 2

## 2014-08-20 MED ORDER — ONDANSETRON HCL 4 MG PO TABS: 4.0000 mg | ORAL_TABLET | Freq: Four times a day (QID) | ORAL | Status: DC | PRN

## 2014-08-20 MED ORDER — MIDAZOLAM HCL 2 MG/2ML IJ SOLN
INTRAMUSCULAR | Status: DC | PRN
  Administered 2014-08-20: 2 mg via INTRAVENOUS

## 2014-08-20 MED ORDER — SCOPOLAMINE 1 MG/3DAYS TD PT72
1.0000 | MEDICATED_PATCH | Freq: Once | TRANSDERMAL | Status: DC
  Administered 2014-08-20: 1.5 mg via TRANSDERMAL

## 2014-08-20 MED ORDER — DOCUSATE SODIUM 100 MG PO CAPS: 100.0000 mg | ORAL_CAPSULE | Freq: Two times a day (BID) | ORAL | Status: DC

## 2014-08-20 MED ORDER — LACTATED RINGERS IR SOLN
Status: DC | PRN
  Administered 2014-08-20: 3000 mL

## 2014-08-20 MED ORDER — GLYCOPYRROLATE 0.2 MG/ML IJ SOLN
INTRAMUSCULAR | Status: AC
  Filled 2014-08-20: qty 3

## 2014-08-20 MED ORDER — PNEUMOCOCCAL VAC POLYVALENT 25 MCG/0.5ML IJ INJ
0.5000 mL | INJECTION | INTRAMUSCULAR | Status: AC
  Administered 2014-08-21: 0.5 mL via INTRAMUSCULAR
  Filled 2014-08-20: qty 0.5

## 2014-08-20 MED ORDER — ROCURONIUM BROMIDE 100 MG/10ML IV SOLN
INTRAVENOUS | Status: DC | PRN
  Administered 2014-08-20: 10 mg via INTRAVENOUS
  Administered 2014-08-20: 40 mg via INTRAVENOUS

## 2014-08-20 MED ORDER — EPHEDRINE SULFATE 50 MG/ML IJ SOLN
INTRAMUSCULAR | Status: DC | PRN
  Administered 2014-08-20: 10 mg via INTRAVENOUS

## 2014-08-20 MED ORDER — MENTHOL 3 MG MT LOZG: 1.0000 | LOZENGE | OROMUCOSAL | Status: DC | PRN

## 2014-08-20 MED ORDER — ONDANSETRON HCL 4 MG/2ML IJ SOLN
INTRAMUSCULAR | Status: AC
  Filled 2014-08-20: qty 2

## 2014-08-20 MED ORDER — KETOROLAC TROMETHAMINE 30 MG/ML IJ SOLN
INTRAMUSCULAR | Status: AC
Start: 2014-08-20 — End: 2014-08-20
  Filled 2014-08-20: qty 1

## 2014-08-20 MED ORDER — KETOROLAC TROMETHAMINE 30 MG/ML IJ SOLN: 30.0000 mg | Freq: Four times a day (QID) | INTRAMUSCULAR | Status: DC

## 2014-08-20 MED ORDER — DEXAMETHASONE SODIUM PHOSPHATE 4 MG/ML IJ SOLN
INTRAMUSCULAR | Status: AC
  Filled 2014-08-20: qty 1

## 2014-08-20 MED ORDER — KETOROLAC TROMETHAMINE 30 MG/ML IJ SOLN
30.0000 mg | Freq: Four times a day (QID) | INTRAMUSCULAR | Status: DC
  Administered 2014-08-20 – 2014-08-21 (×3): 30 mg via INTRAVENOUS
  Filled 2014-08-20 (×3): qty 1

## 2014-08-20 MED ORDER — BISACODYL 5 MG PO TBEC
5.0000 mg | DELAYED_RELEASE_TABLET | Freq: Every day | ORAL | Status: DC | PRN
  Filled 2014-08-20: qty 1

## 2014-08-20 MED ORDER — ONDANSETRON HCL 4 MG/2ML IJ SOLN: 4.0000 mg | Freq: Once | INTRAMUSCULAR | Status: DC | PRN

## 2014-08-20 MED ORDER — GLYCOPYRROLATE 0.2 MG/ML IJ SOLN
INTRAMUSCULAR | Status: DC | PRN
  Administered 2014-08-20: 0.4 mg via INTRAVENOUS

## 2014-08-20 MED ORDER — FENTANYL CITRATE (PF) 250 MCG/5ML IJ SOLN
INTRAMUSCULAR | Status: AC
  Filled 2014-08-20: qty 5

## 2014-08-20 MED ORDER — VASOPRESSIN 20 UNIT/ML IV SOLN
INTRAVENOUS | Status: AC
  Filled 2014-08-20: qty 1

## 2014-08-20 MED ORDER — 0.9 % SODIUM CHLORIDE (POUR BTL) OPTIME
TOPICAL | Status: DC | PRN
  Administered 2014-08-20: 2000 mL
  Administered 2014-08-20: 1000 mL

## 2014-08-20 MED ORDER — BUPROPION HCL ER (XL) 300 MG PO TB24
300.0000 mg | ORAL_TABLET | Freq: Every day | ORAL | Status: DC
  Administered 2014-08-21: 300 mg via ORAL
  Filled 2014-08-20 (×2): qty 1

## 2014-08-20 SURGICAL SUPPLY — 47 items
APPLICATOR SURGIFLO (MISCELLANEOUS) ×4 IMPLANT
CABLE HIGH FREQUENCY MONO STRZ (ELECTRODE) ×4 IMPLANT
CLOTH BEACON ORANGE TIMEOUT ST (SAFETY) ×4 IMPLANT
DRAPE SHEET LG 3/4 BI-LAMINATE (DRAPES) ×12 IMPLANT
DRSG COVADERM PLUS 2X2 (GAUZE/BANDAGES/DRESSINGS) ×8 IMPLANT
DRSG OPSITE POSTOP 3X4 (GAUZE/BANDAGES/DRESSINGS) ×4 IMPLANT
DURAPREP 26ML APPLICATOR (WOUND CARE) ×8 IMPLANT
FORCEPS CUTTING 33CM 5MM (CUTTING FORCEPS) IMPLANT
GAUZE PACKING 1 X5 YD ST (GAUZE/BANDAGES/DRESSINGS) IMPLANT
GAUZE PACKING IODOFORM 2 (PACKING) ×4 IMPLANT
GLOVE BIOGEL M 7.0 STRL (GLOVE) ×8 IMPLANT
GLOVE BIOGEL PI IND STRL 6.5 (GLOVE) ×6 IMPLANT
GLOVE BIOGEL PI IND STRL 7.0 (GLOVE) ×24 IMPLANT
GLOVE BIOGEL PI INDICATOR 6.5 (GLOVE) ×2
GLOVE BIOGEL PI INDICATOR 7.0 (GLOVE) ×8
GLOVE ECLIPSE 6.5 STRL STRAW (GLOVE) ×16 IMPLANT
GLOVE ECLIPSE 7.0 STRL STRAW (GLOVE) ×4 IMPLANT
GLOVE NEODERM STER SZ 7 (GLOVE) ×4 IMPLANT
GOWN STRL REUS W/TWL LRG LVL3 (GOWN DISPOSABLE) ×8 IMPLANT
LIQUID BAND (GAUZE/BANDAGES/DRESSINGS) ×4 IMPLANT
NEEDLE INSUFFLATION 120MM (ENDOMECHANICALS) ×4 IMPLANT
PACK LAPAROSCOPY BASIN (CUSTOM PROCEDURE TRAY) ×4 IMPLANT
PACK ROBOTIC GOWN (GOWN DISPOSABLE) ×4 IMPLANT
PAD OB MATERNITY 4.3X12.25 (PERSONAL CARE ITEMS) ×4 IMPLANT
PAD POSITIONER PINK NONSTERILE (MISCELLANEOUS) ×4 IMPLANT
PROTECTOR NERVE ULNAR (MISCELLANEOUS) ×4 IMPLANT
SCISSORS LAP 5X35 DISP (ENDOMECHANICALS) ×4 IMPLANT
SET IRRIG TUBING LAPAROSCOPIC (IRRIGATION / IRRIGATOR) ×4 IMPLANT
SHEARS HARMONIC ACE PLUS 36CM (ENDOMECHANICALS) ×4 IMPLANT
SLEEVE XCEL OPT CAN 5 100 (ENDOMECHANICALS) ×4 IMPLANT
SPOGE SURGIFLO 8M (HEMOSTASIS)
SPONGE SURGIFLO 8M (HEMOSTASIS) IMPLANT
STRIP CLOSURE SKIN 1/4X4 (GAUZE/BANDAGES/DRESSINGS) IMPLANT
SURGIFLO W/THROMBIN 8M KIT (HEMOSTASIS) ×4 IMPLANT
SUT MON AB 4-0 PS1 27 (SUTURE) ×4 IMPLANT
SUT VIC AB 0 CT1 18XCR BRD8 (SUTURE) ×6 IMPLANT
SUT VIC AB 0 CT1 36 (SUTURE) ×8 IMPLANT
SUT VIC AB 0 CT1 8-18 (SUTURE) ×2
SUT VICRYL 0 TIES 12 18 (SUTURE) ×4 IMPLANT
SUT VICRYL 0 UR6 27IN ABS (SUTURE) IMPLANT
SYR CONTROL 10ML LL (SYRINGE) ×8 IMPLANT
TOWEL OR 17X24 6PK STRL BLUE (TOWEL DISPOSABLE) ×8 IMPLANT
TRAY FOLEY CATH SILVER 14FR (SET/KITS/TRAYS/PACK) ×4 IMPLANT
TROCAR XCEL NON-BLD 11X100MML (ENDOMECHANICALS) ×4 IMPLANT
TROCAR XCEL NON-BLD 5MMX100MML (ENDOMECHANICALS) ×4 IMPLANT
WARMER LAPAROSCOPE (MISCELLANEOUS) ×4 IMPLANT
WATER STERILE IRR 1000ML POUR (IV SOLUTION) ×4 IMPLANT

## 2014-08-20 NOTE — Anesthesia Postprocedure Evaluation (Signed)
  Anesthesia Post-op Note  Patient: Morgan Hooper  Procedure(s) Performed: Procedure(s): LAPAROSCOPIC ASSISTED VAGINAL HYSTERECTOMY (N/A) LAPAROSCOPIC OOPHORECTOMY (Bilateral) UNILATERAL SALPINGECTOMY (Left)  Patient Location: PACU  Anesthesia Type:General  Level of Consciousness: awake, alert  and oriented  Airway and Oxygen Therapy: Patient Spontanous Breathing  Post-op Pain: mild  Post-op Assessment: Post-op Vital signs reviewed, Patient's Cardiovascular Status Stable, Respiratory Function Stable, Patent Airway, No signs of Nausea or vomiting and Pain level controlled              Post-op Vital Signs: Reviewed and stable  Last Vitals:  Filed Vitals:   08/20/14 1400  BP: 110/57  Pulse: 81  Temp:   Resp: 20    Complications: No apparent anesthesia complications

## 2014-08-20 NOTE — Anesthesia Procedure Notes (Signed)
Procedure Name: Intubation Date/Time: 08/20/2014 10:53 AM Performed by: Bufford Spikes Pre-anesthesia Checklist: Patient identified, Timeout performed, Emergency Drugs available, Suction available and Patient being monitored Patient Re-evaluated:Patient Re-evaluated prior to inductionOxygen Delivery Method: Circle system utilized Preoxygenation: Pre-oxygenation with 100% oxygen Intubation Type: IV induction Ventilation: Mask ventilation without difficulty Laryngoscope Size: Miller and 2 Grade View: Grade I Tube type: Oral Tube size: 7.0 mm Number of attempts: 1 Airway Equipment and Method: Stylet Placement Confirmation: ETT inserted through vocal cords under direct vision,  positive ETCO2 and breath sounds checked- equal and bilateral Secured at: 20 cm Tube secured with: Tape Dental Injury: Teeth and Oropharynx as per pre-operative assessment

## 2014-08-20 NOTE — Anesthesia Preprocedure Evaluation (Addendum)
Anesthesia Evaluation  Patient identified by MRN, date of birth, ID band Patient awake    Reviewed: Allergy & Precautions, NPO status , Patient's Chart, lab work & pertinent test results  History of Anesthesia Complications Negative for: history of anesthetic complications  Airway Mallampati: II  TM Distance: >3 FB Neck ROM: Full    Dental no notable dental hx. (+) Dental Advisory Given   Pulmonary asthma ,  breath sounds clear to auscultation  Pulmonary exam normal       Cardiovascular negative cardio ROS Normal cardiovascular examRhythm:Regular Rate:Normal     Neuro/Psych  Headaches, PSYCHIATRIC DISORDERS Anxiety Depression    GI/Hepatic Neg liver ROS, GERD-  Medicated and Controlled,  Endo/Other  negative endocrine ROS  Renal/GU negative Renal ROS  negative genitourinary   Musculoskeletal negative musculoskeletal ROS (+)   Abdominal   Peds negative pediatric ROS (+)  Hematology negative hematology ROS (+)   Anesthesia Other Findings   Reproductive/Obstetrics negative OB ROS                             Anesthesia Physical Anesthesia Plan  ASA: II  Anesthesia Plan: General   Post-op Pain Management:    Induction: Intravenous  Airway Management Planned: Oral ETT  Additional Equipment:   Intra-op Plan:   Post-operative Plan: Extubation in OR  Informed Consent: I have reviewed the patients History and Physical, chart, labs and discussed the procedure including the risks, benefits and alternatives for the proposed anesthesia with the patient or authorized representative who has indicated his/her understanding and acceptance.   Dental advisory given  Plan Discussed with: CRNA  Anesthesia Plan Comments:         Anesthesia Quick Evaluation

## 2014-08-20 NOTE — Discharge Instructions (Signed)
Laparoscopically Assisted Vaginal Hysterectomy, Care After Refer to this sheet in the next few weeks. These instructions provide you with information on caring for yourself after your procedure. Your health care provider may also give you more specific instructions. Your treatment has been planned according to current medical practices, but problems sometimes occur. Call your health care provider if you have any problems or questions after your procedure. WHAT TO EXPECT AFTER THE PROCEDURE After your procedure, it is typical to have the following:  Abdominal pain. You will be given pain medicine to control it.  Sore throat from the breathing tube that was inserted during surgery. HOME CARE INSTRUCTIONS  Only take over-the-counter or prescription medicines for pain, discomfort, or fever as directed by your health care provider.  You may alternate between percocet and motrin as needed for pain.  Percocet may cause constipation, if you take this medication, please continue to take Colace (stool softener) twice daily.  Do not take aspirin. It can cause bleeding.  Do not drive when taking pain medicine.  Follow your health care provider's advice regarding diet, exercise, lifting, driving, and general activities.  Resume your usual diet as directed and allowed.  Get plenty of rest and sleep.  Do not douche, use tampons, or have sexual intercourse for at least 6 weeks, or until your health care provider gives you permission.  Change your bandages (dressings) as directed by your health care provider.  Monitor your temperature and notify your health care provider of a fever.  Take showers instead of baths for 2-3 weeks.  Do not drink alcohol until your health care provider gives you permission.  If you develop constipation, you may take a mild laxative with your health care provider's permission. Bran foods may help with constipation problems. Drinking enough fluids to keep your urine clear or  pale yellow may help as well.  Try to have someone home with you for 1-2 weeks to help around the house.  Keep all of your follow-up appointments as directed by your health care provider. SEEK MEDICAL CARE IF:   You have swelling, redness, or increasing pain around your incision sites.  You have pus coming from your incision.  You notice a bad smell coming from your incision.  Your incision breaks open.  You feel dizzy or lightheaded.  You have pain or bleeding when you urinate.  You have persistent diarrhea.  You have persistent nausea and vomiting.  You have abnormal vaginal discharge.  You have a rash.  You have any type of abnormal reaction or develop an allergy to your medicine.  You have poor pain control with your prescribed medicine. SEEK IMMEDIATE MEDICAL CARE IF:   You have a fever.  You have severe abdominal pain.  You have chest pain.  You have shortness of breath.  You faint.  You have pain, swelling, or redness in your leg.  You have heavy vaginal bleeding with blood clots. MAKE SURE YOU:  Understand these instructions.  Will watch your condition.  Will get help right away if you are not doing well or get worse. Document Released: 02/03/2011 Document Revised: 02/19/2013 Document Reviewed: 08/30/2012 [facility] Patient Information 2015 [place], [place]. This information is not intended to replace advice given to you by your health care provider. Make sure you discuss any questions you have with your health care provider.

## 2014-08-20 NOTE — Op Note (Signed)
Preoperative Diagnosis: Chronic pelvic pain and uterine prolapse Postoperative Diagnosis: same  Procedure: Laparoscopy Assisted Vaginal Hysterectomy, Bilateral oophorectomy, left salpingectomy Surgeon: Dr. [name]  Assistant: Dr. Cyndia Skeeters  Anesthetic: General  IVF: 2600 EBL: 200cc  UOP: 125cc  Specimens: 1) Uterus with bilateral ovaries and left fallopian tube  Findings: No free fluid or omental studding appreciated. Normal appearing liver, gallbladder and bowel. Small anteverted uterus, normal left tube and ovary. Normal right ovary, absent right tube from prior salpingectomy secondary to ectopic pregnancy.     Procedure: The patient was taken to the operating room where general anesthesia was found to be adequate. The patient's abdomen was prepped with ChloraPrep. The perineum and vagina were prepped with multiple layers of Betadine. The patient was sterilely draped. A Foley catheter was placed in the bladder. A Hulka tenaculum was placed inside the uterus. Gown and gloves were changed and attention was turned to the abdomen. An incision was made in the supraumbilical area and the Veress needle was inserted into the abdominal cavity without difficulty. Proper placement was confirmed using the saline drop test and opening pressure was 45mmHg. A pneumoperitoneum was obtained. The laparoscopic trocar and the laparoscope were placed under direct visualization. Two additional ports were placed in the right and left lower quadrants in the following fashion: Each area was injected with half percent Marcaine with epinephrine. A small incision was made and a 5 mm trocar was inserted into the abdominal cavity under direct visualization. An abdominal scan was performed with the findings noted above.  Attention was turned to the right adnexa and the ureter was idenitifed. The infundibulopelvic ligament was clamped, elevated, ligated and divided using the Harmonic. Serial ligation was performed up to  the proximal portion of the utero-ovarian ligament and cornua of the fallopian tube. The right round ligament was then grasped, elevated, fulgurated and divided using the Harmonic. Attention was turned to the left adnexa. In a similar fashion, the left ureter was identified. The left infundibulopelvic ligament was clamped, elevated, ligated and divided using the Harmonic and serial ligation was performed to remove the tube and ovary. The left round ligament was clamped, elevated, ligated, and divided. The vesicouterine fold of peritoneum was incised anteriorly for dissection of the vesicouterine flap. Hemostasis was noted. The case then proceeded to the vaginal portion.  The Hulka was removed and a weighted speculum was placed in the posterior vagina. The cervix was injected with half percent Marcaine with epinephrine. The cerix was then circumferentially incised with the bovie and the bladder was dissected off the pubovesical cervical fascia. The anterior cul-de-sac was entered sharply. The same procedure was performed posteriorly and the posterior cu-lde-sac was entered sharply without difficulty. A heany clamp was placed over the uterosacral ligaments bilaterally. These were transected and suture ligated with 0 vicryl. The cardinal ligaments were then clamped bilaterally and transected and suture ligated in a similar fashion. The uterine arteries and broad ligament was then serially clamped with heany clamps, transected and suture ligated bilaterally. The uterus, bilateral fallopian tube and ovaries were removed from the operative field and sent to pathology. Hemostasis was confirmed.  McCall's culdoplasty was performed using 0-vicryl.  The sutures attached to the uterosacral ligaments were brought out through the vaginal angles and then tied securely together. A final check was made for hemostasis and confirmed. The vaginal cuff was closed in a running locked fashion using 0 vicryl. Vaginal packing was placed.  Gown and gloves were changed and attention was turned to  the abdomen.  The pneumoperitoneum was reestablished. The pelvis was irrigated and inspected, slight oozing was noted in the posterior cul-de-sac near the cuff, but no active bleeding was identified. Both ureters were seen and peristalsis appreciated.  Floseal was placed over the cul-de-sac and hemostasis was noted. The lateral 5 mm trocars were removed under direct visualization. The pneumoperitoneum was allowed to escape. The infraumbilcal trocar was removed. Dermabond was placed over the three trocar sites. The patient tolerated the procedure well. She was awakened from her anesthetic without difficulty and then transported to the recovery room in stable condition. Sponge, needle, and instrument counts were correct. Dr. Simona Huh was present to assist as this is a major surgical case and residents are not available to our service.  [name], DO  [phone] (pager)  [phone] (office)

## 2014-08-20 NOTE — Transfer of Care (Signed)
Immediate Anesthesia Transfer of Care Note  Patient: LAYALI FREUND  Procedure(s) Performed: Procedure(s): LAPAROSCOPIC ASSISTED VAGINAL HYSTERECTOMY (N/A) LAPAROSCOPIC OOPHORECTOMY (Bilateral) UNILATERAL SALPINGECTOMY (Left)  Patient Location: PACU  Anesthesia Type:General  Level of Consciousness: awake, alert  and oriented  Airway & Oxygen Therapy: Patient Spontanous Breathing and Patient connected to nasal cannula oxygen  Post-op Assessment: Report given to RN and Post -op Vital signs reviewed and stable  Post vital signs: Reviewed and stable  Last Vitals:  Filed Vitals:   08/20/14 0945  BP: 126/84  Pulse: 73  Temp: 36.4 C  Resp: 20    Complications: No apparent anesthesia complications

## 2014-08-20 NOTE — Anesthesia Postprocedure Evaluation (Signed)
  Anesthesia Post-op Note  Patient: Morgan Hooper  Procedure(s) Performed: Procedure(s): LAPAROSCOPIC ASSISTED VAGINAL HYSTERECTOMY (N/A) LAPAROSCOPIC OOPHORECTOMY (Bilateral) UNILATERAL SALPINGECTOMY (Left)  Patient Location: [facility]  Anesthesia Type:General  Level of Consciousness: awake, alert , oriented and patient cooperative  Airway and Oxygen Therapy: Patient Spontanous Breathing and Patient connected to nasal cannula oxygen  Post-op Pain: none  Post-op Assessment: Post-op Vital signs reviewed, Patient's Cardiovascular Status Stable, Respiratory Function Stable, Patent Airway and No signs of Nausea or vomiting              Post-op Vital Signs: Reviewed and stable  Last Vitals:  Filed Vitals:   08/20/14 1500  BP: 98/63  Pulse: 69  Temp: 36.6 C  Resp: 16    Complications: No apparent anesthesia complications

## 2014-08-20 NOTE — Addendum Note (Signed)
Addendum  created 08/20/14 1525 by Raenette Rover, CRNA   Modules edited: Notes Section   Notes Section:  File: [id number]

## 2014-08-20 NOTE — Interval H&P Note (Signed)
History and Physical Interval Note:  08/20/2014 10:19 AM  Morgan Hooper  has presented today for surgery, with the diagnosis of R10.2  Pelvic Pain  The various methods of treatment have been discussed with the patient and family. After consideration of risks, benefits and other options for treatment, the patient has consented to  Procedure(s): LAPAROSCOPIC ASSISTED VAGINAL HYSTERECTOMY (N/A) LAPAROSCOPIC SALPINGO OOPHORECTOMY (Bilateral) as a surgical intervention .  The patient's history has been reviewed, patient examined, no change in status, stable for surgery.  I have reviewed the patient's chart and labs.  Questions were answered to the patient's satisfaction.     [name], M

## 2014-08-21 DIAGNOSIS — R102 Pelvic and perineal pain: Secondary | ICD-10-CM | POA: Diagnosis not present

## 2014-08-21 LAB — BASIC METABOLIC PANEL
Anion gap: 2 — ABNORMAL LOW (ref 5–15)
BUN: 6 mg/dL (ref 6–20)
CHLORIDE: 111 mmol/L (ref 101–111)
CO2: 26 mmol/L (ref 22–32)
CREATININE: 0.95 mg/dL (ref 0.44–1.00)
Calcium: 8.1 mg/dL — ABNORMAL LOW (ref 8.9–10.3)
GFR calc Af Amer: >60 mL/min (ref >60)
GFR calc non Af Amer: >60 mL/min (ref >60)
Glucose, Bld: 78 mg/dL (ref 65–99)
POTASSIUM: 4 mmol/L (ref 3.5–5.1)
Sodium: 139 mmol/L (ref 135–145)

## 2014-08-21 LAB — CBC
HEMATOCRIT: 31.5 % — AB (ref 36.0–46.0)
HEMOGLOBIN: 10.5 g/dL — AB (ref 12.0–15.0)
MCH: 31.1 pg (ref 26.0–34.0)
MCHC: 33.3 g/dL (ref 30.0–36.0)
MCV: 93.2 fL (ref 78.0–100.0)
Platelets: 258 10*3/uL (ref 150–400)
RBC: 3.38 MIL/uL — ABNORMAL LOW (ref 3.87–5.11)
RDW: 12.8 % (ref 11.5–15.5)
WBC: 7.2 10*3/uL (ref 4.0–10.5)

## 2014-08-21 MED ORDER — IBUPROFEN 600 MG PO TABS: 600.0000 mg | ORAL_TABLET | Freq: Four times a day (QID) | ORAL | Status: DC | PRN

## 2014-08-21 MED ORDER — OXYCODONE-ACETAMINOPHEN 5-325 MG PO TABS: 1.0000 | ORAL_TABLET | Freq: Four times a day (QID) | ORAL | Status: DC | PRN

## 2014-08-21 MED ORDER — IBUPROFEN 600 MG PO TABS
600.0000 mg | ORAL_TABLET | Freq: Four times a day (QID) | ORAL | Status: DC | PRN
  Administered 2014-08-21: 600 mg via ORAL
  Filled 2014-08-21 (×2): qty 1

## 2014-08-21 MED ORDER — PANTOPRAZOLE SODIUM 40 MG PO TBEC
40.0000 mg | DELAYED_RELEASE_TABLET | Freq: Two times a day (BID) | ORAL | Status: DC
  Administered 2014-08-21: 40 mg via ORAL
  Filled 2014-08-21: qty 1

## 2014-08-21 NOTE — Progress Notes (Addendum)
Postop Note Day # 1  S:  Patient resting comfortable in bed.  Pain controlled.  Tolerating CLD. No flatus, no BM. Minimal spotting following removal of vaginal packing.  Ambulating without difficulty.  She denies n/v/f/c, SOB, or CP.  Foley removed this am, pt has not yet voided.  O: Temp:  [97.5 F (36.4 C)-98.7 F (37.1 C)] 98.7 F (37.1 C) (06/23 0509) Pulse Rate:  [69-85] 85 (06/23 0509) Resp:  [12-20] 16 (06/23 0509) BP: (94-126)/(57-84) 94/65 mmHg (06/23 0509) SpO2:  [96 %-100 %] 99 % (06/23 0509) Gen: A&Ox3, NAD CV: RRR, no MRG Resp: CTAB Abdomen: soft, NT, ND +BS Trocar incisions: c/d/i, bandages in place Ext: No edema, no calf tenderness bilaterally, SCDs at bedside  Labs: CBC pending  A/P: Pt is a 49 y.o. s/p LAVH, bilateral oophorectomy, left salpingectomy, POD#1  - Pain well controlled, will transition to oral pain management -GU: foley removed, pt to void this am -GI: Advance to general diet, will transition to oral protonix -Activity: encouraged sitting up to chair and ambulation as tolerated -Prophylaxis: encourage ambulation and SCDs while in bed -Labs: CBC, BMP pending this am -Depression: continue home medication regimen -Headaches: pt to continue home Topamax, Inderal discontinued due to blood pressure concerns.  Meeting postoperative milestones appropriately, plan for discharge home later today if pain well controlled with oral medication and tolerating general diet.  [name], DO [phone] (pager) [phone] (office)

## 2014-08-21 NOTE — Progress Notes (Signed)
Pt verbalizes understanding of d/c instructions, medications, follow up appts, when to seek medical attention and belongings policy. IV was d/c by NT prior to d/c without complications. Pt has no questions at this time. Pt has copy of d/c instructions and recovering from surgery book. Pt has prescription in hand at time of d/c. Pt escorted to the main entrance, accompanied by NT. Pts husband is with her and will be driving her home. Marry Guan

## 2014-08-23 ENCOUNTER — Encounter (HOSPITAL_COMMUNITY): Payer: Self-pay | Admitting: Obstetrics & Gynecology

## 2014-08-25 NOTE — Discharge Summary (Signed)
Physician Discharge Summary  Patient ID: Morgan Hooper MRN: [id number] DOB/AGE: Apr 16, 1965 49 y.o.  Admit date: 08/20/2014 Discharge date: 08/21/2014  Admission Diagnoses: Chronic pelvic pain  Discharge Diagnoses:  Active Problems:   Chronic pelvic pain in female   Discharged Condition: stable  Hospital Course: Morgan Hooper is 49yo postmenopausal female who presents for LAVH, BSO due to chronic pelvic pain. In review, she reports several months of intermittent crampy sometimes sharp, non-radiating 10/10 pelvic pain. Usually occurs following intercourse, but also with activity (like bending down). Denies vaginal dryness, the dyspareunia is during and several minutes following IC. Pain improves after prolonged rest and is greatly impacting her day to day. She has tried OTC medications with minimal and is currently undergoing PT. While the PT has helped, she is still reporting significant dyspareunia and desired to proceed with surgical management. She underwent an LAVH, bilateral oophorectomy, left salpingectomy on 6/22, for information regarding the procedure, please see the operative report.  Her postoperative course was uncomplicated as she met all her milestones appropriately and was discharge home on stable condition on POD#1.   Consults: None  Significant Diagnostic Studies: labs:  CBC Latest Ref Rng 08/21/2014 08/04/2014 07/15/2010  WBC 4.0 - 10.5 K/uL 7.2 5.3 -  Hemoglobin 12.0 - 15.0 g/dL 10.5(L) 12.2 13.9  Hematocrit 36.0 - 46.0 % 31.5(L) 36.3 41.0  Platelets 150 - 400 K/uL 258 223 -   Treatments: IV hydration, antibiotics: Ancef, analgesia: Toradol, Percocet and surgery: LAVH, bilateral oophorectomy, left salpingectomy  Discharge Exam: Blood pressure 138/80, pulse 86, temperature 97.9 F (36.6 C), temperature source Oral, resp. rate 18, height '5\' 1"'  (1.549 m), weight 71.838 kg (158 lb 6 oz), SpO2 100 %. Gen: A&Ox3, NAD CV: RRR, no MRG Resp: CTAB Abdomen: soft, NT, ND  +BS Trocar incisions: c/d/i, bandages in place Ext: No edema, no calf tenderness bilaterally, SCDs at bedside  Disposition: 01-Home or Self Care     Medication List    TAKE these medications        ALPRAZolam 1 MG tablet  Commonly known as:  XANAX  Take 1 mg by mouth 2 (two) times daily as needed for anxiety or sleep.     atorvastatin 40 MG tablet  Commonly known as:  LIPITOR  Take 40 mg by mouth daily at 6 PM.     buPROPion 300 MG 24 hr tablet  Commonly known as:  WELLBUTRIN XL  Take 300 mg by mouth daily.     cholecalciferol 1000 UNITS tablet  Commonly known as:  VITAMIN D  Take 2,000 Units by mouth 2 (two) times daily.     docusate sodium 100 MG capsule  Commonly known as:  COLACE  Take 1 capsule (100 mg total) by mouth 2 (two) times daily.     esomeprazole 40 MG capsule  Commonly known as:  NEXIUM  Take 40 mg by mouth 2 (two) times daily before a meal.     HYDROcodone-acetaminophen 5-325 MG per tablet  Commonly known as:  NORCO/VICODIN  Take 1 tablet by mouth every 6 (six) hours as needed for moderate pain.     ibuprofen 600 MG tablet  Commonly known as:  ADVIL,MOTRIN  Take 1 tablet (600 mg total) by mouth every 6 (six) hours as needed for mild pain or moderate pain.     mometasone 50 MCG/ACT nasal spray  Commonly known as:  NASONEX  Place 2 sprays into the nose daily.     oxyCODONE-acetaminophen 5-325 MG per tablet  Commonly known as:  PERCOCET/ROXICET  Take 1 tablet by mouth every 6 (six) hours as needed (moderate to severe pain (when tolerating fluids)).     propranolol 60 MG tablet  Commonly known as:  INDERAL  Take 60 mg by mouth 2 (two) times daily.     topiramate 100 MG tablet  Commonly known as:  TOPAMAX  Take 200 mg by mouth 2 (two) times daily.           Follow-up Information    Follow up with Morgan Hooper, M, DO In 2 weeks.   Specialty:  Obstetrics and Gynecology   Contact information:   [address] [place]  [address] [phone]       Signed: [name] 08/25/2014, 7:11 AM

## 2014-10-27 ENCOUNTER — Other Ambulatory Visit: Payer: Self-pay | Admitting: Orthopedic Surgery

## 2014-10-27 DIAGNOSIS — M79672 Pain in left foot: Secondary | ICD-10-CM

## 2014-10-30 ENCOUNTER — Ambulatory Visit
Admission: RE | Admit: 2014-10-30 | Discharge: 2014-10-30 | Disposition: A | Discharge status: Home / Self Care | Payer: No Typology Code available for payment source | Source: Ambulatory Visit | Attending: Orthopedic Surgery | Admitting: Orthopedic Surgery

## 2014-10-30 DIAGNOSIS — M79672 Pain in left foot: Secondary | ICD-10-CM

## 2015-02-10 ENCOUNTER — Other Ambulatory Visit: Payer: Self-pay | Admitting: Otolaryngology

## 2015-02-10 DIAGNOSIS — R0981 Nasal congestion: Secondary | ICD-10-CM

## 2015-03-05 ENCOUNTER — Ambulatory Visit
Admission: RE | Admit: 2015-03-05 | Discharge: 2015-03-05 | Disposition: A | Discharge status: Home / Self Care | Payer: Managed Care, Other (non HMO) | Source: Ambulatory Visit | Attending: Otolaryngology | Admitting: Otolaryngology

## 2015-03-05 DIAGNOSIS — R0981 Nasal congestion: Secondary | ICD-10-CM

## 2015-06-16 ENCOUNTER — Other Ambulatory Visit: Payer: Self-pay

## 2015-06-16 DIAGNOSIS — Z1231 Encounter for screening mammogram for malignant neoplasm of breast: Secondary | ICD-10-CM

## 2015-07-24 ENCOUNTER — Ambulatory Visit
Admission: RE | Admit: 2015-07-24 | Discharge: 2015-07-24 | Disposition: A | Discharge status: Home / Self Care | Payer: Managed Care, Other (non HMO) | Source: Ambulatory Visit

## 2015-07-24 DIAGNOSIS — Z1231 Encounter for screening mammogram for malignant neoplasm of breast: Secondary | ICD-10-CM

## 2016-04-18 IMAGING — CR DG WRIST COMPLETE 3+V*R*
4 series · 4 of 4 positions shown · non-contrast
Comparison: None.

CLINICAL DATA: Right wrist and left shoulder injury.

EXAM:
RIGHT WRIST - COMPLETE 3+ VIEW

[w wrist pa right]
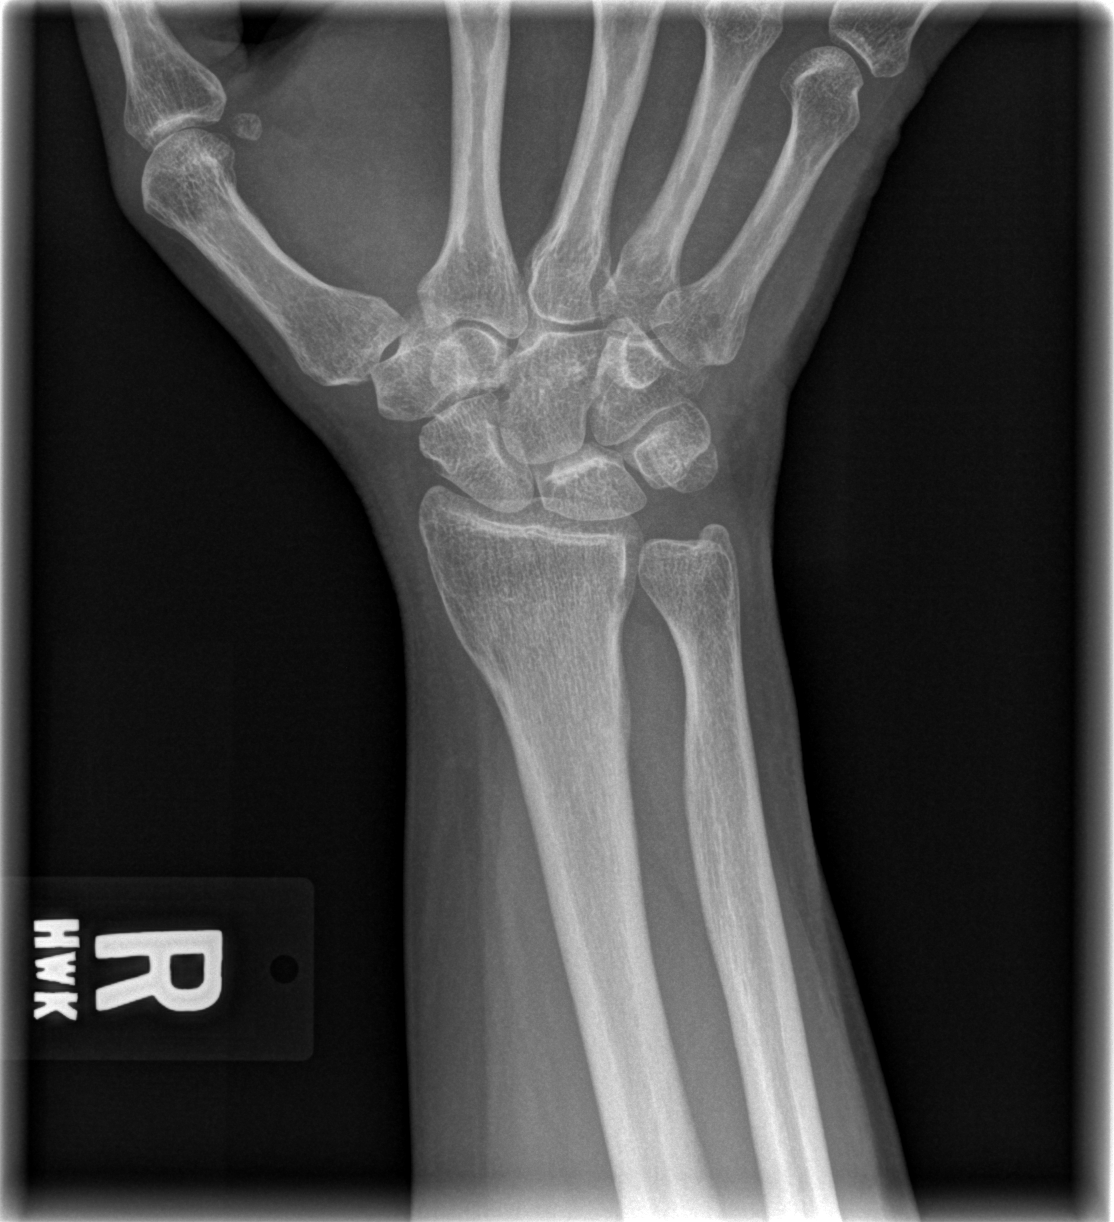

[w wrist obl. right]
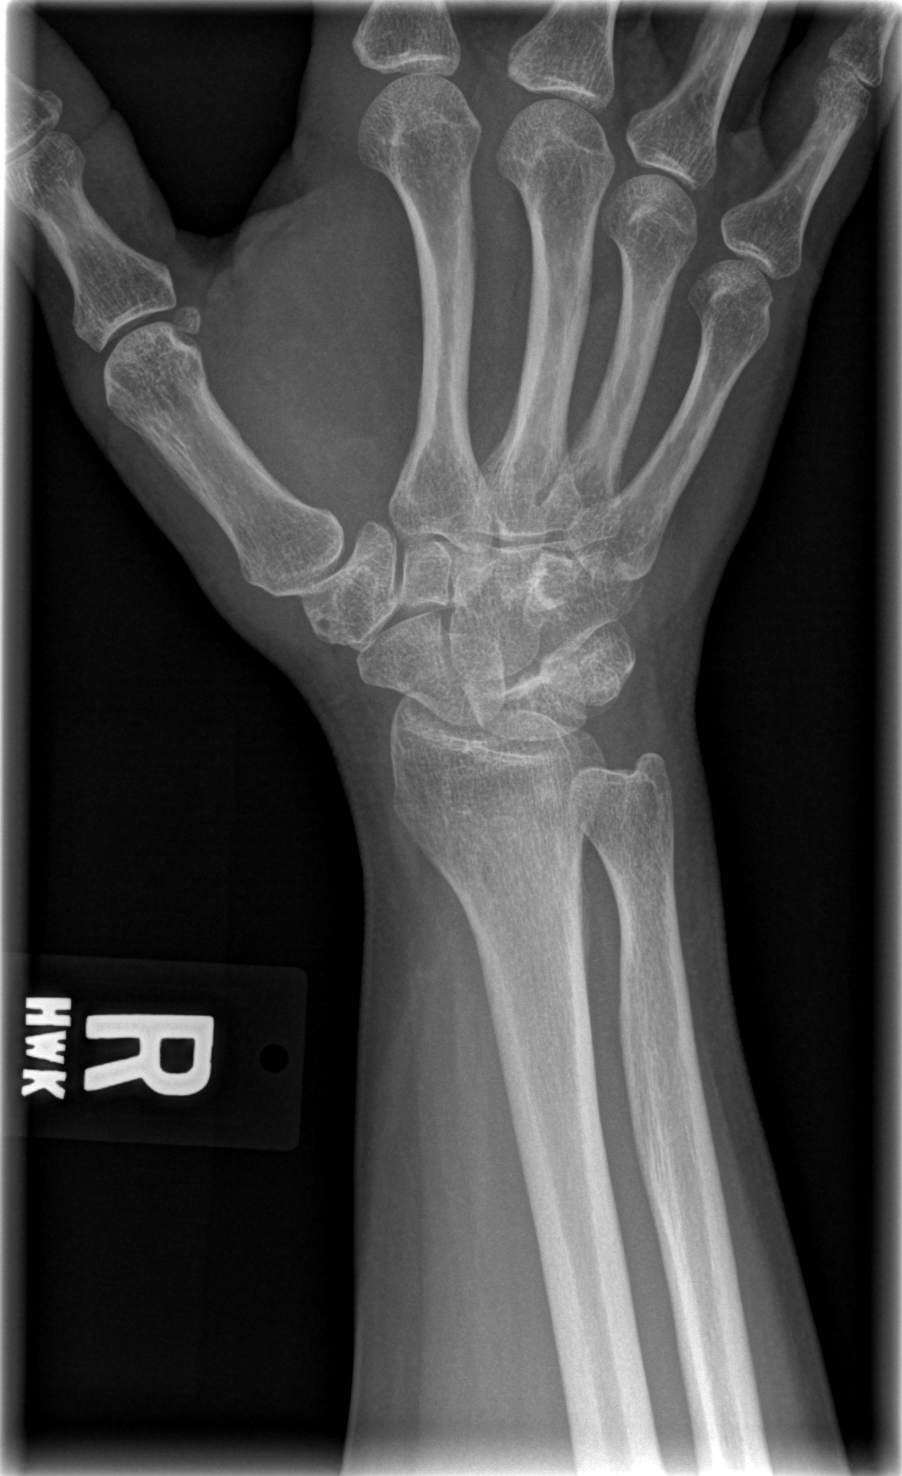

[w wrist lat right]
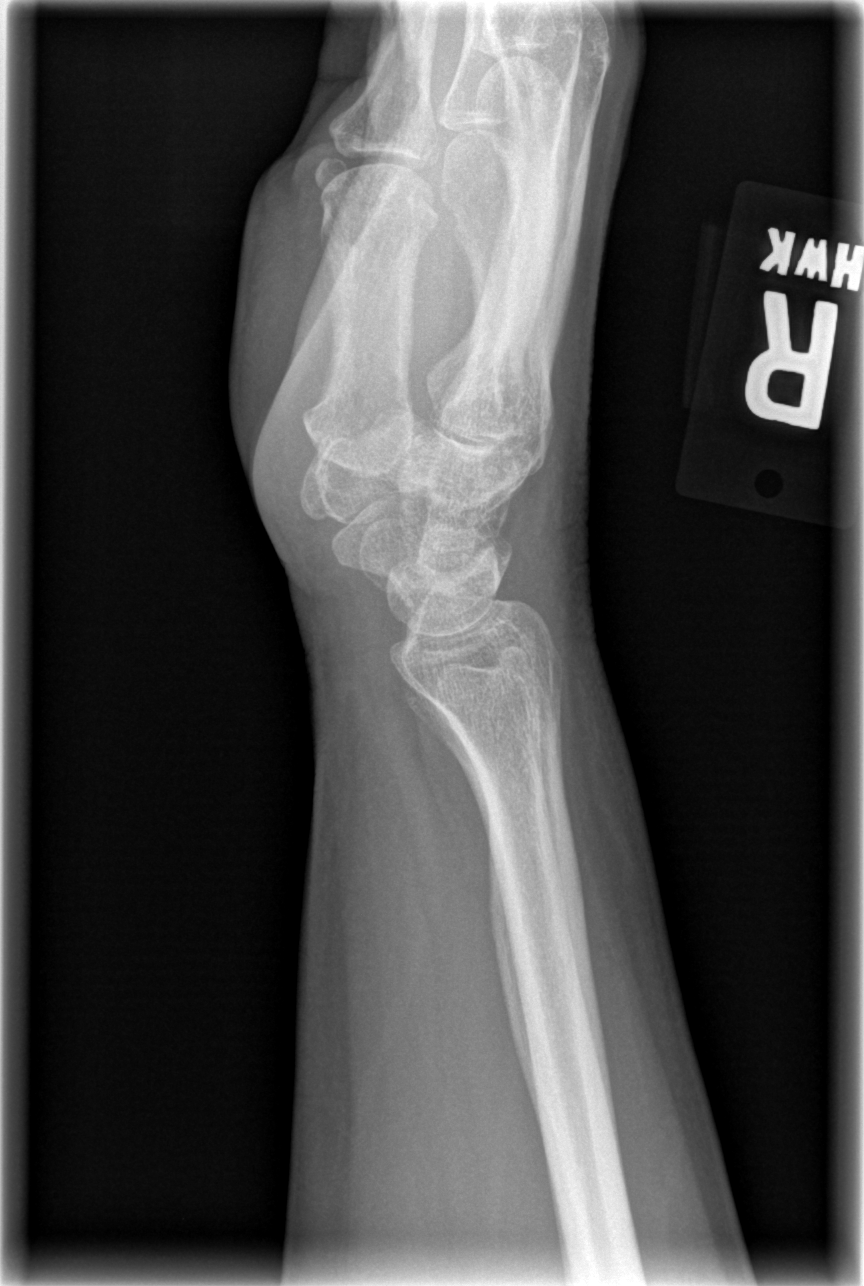

[w navicular]
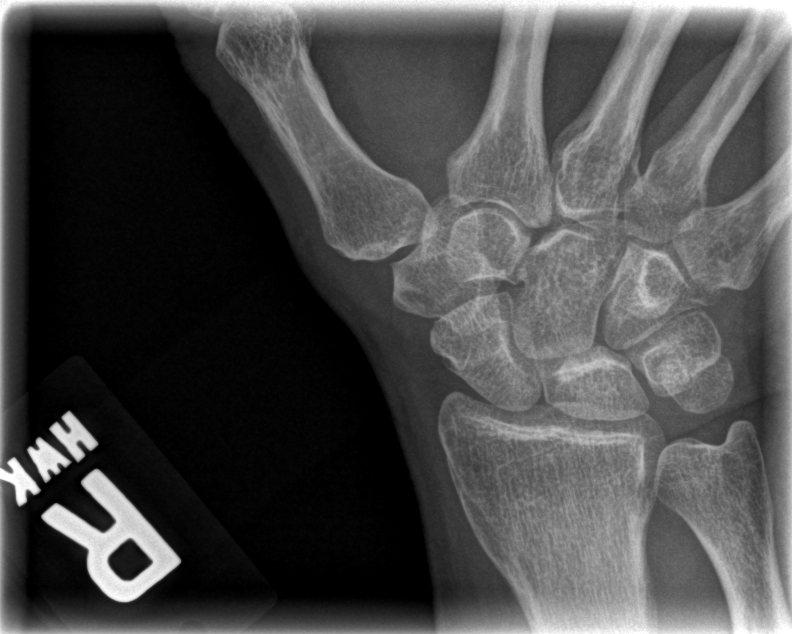

[4 of 4 positions shown; findings below may reference images not displayed]

FINDINGS: The bones of the left wrist are adequately mineralized. There is no
acute fracture nor dislocation. The overlying soft tissues are
unremarkable. There is no significant degenerative change.
IMPRESSION: There is no acute or healing fracture of the right wrist.

## 2016-05-12 ENCOUNTER — Other Ambulatory Visit: Payer: Self-pay | Admitting: Family Medicine

## 2016-05-12 DIAGNOSIS — Z1231 Encounter for screening mammogram for malignant neoplasm of breast: Secondary | ICD-10-CM

## 2016-05-17 ENCOUNTER — Other Ambulatory Visit: Payer: Self-pay | Admitting: Family Medicine

## 2016-05-17 DIAGNOSIS — N6452 Nipple discharge: Secondary | ICD-10-CM

## 2016-05-18 ENCOUNTER — Other Ambulatory Visit: Payer: Self-pay | Admitting: Family Medicine

## 2016-05-18 DIAGNOSIS — N6452 Nipple discharge: Secondary | ICD-10-CM

## 2016-05-23 ENCOUNTER — Ambulatory Visit
Admission: RE | Admit: 2016-05-23 | Discharge: 2016-05-23 | Disposition: A | Discharge status: Home / Self Care | Payer: Managed Care, Other (non HMO) | Source: Ambulatory Visit | Attending: Family Medicine | Admitting: Family Medicine

## 2016-05-23 DIAGNOSIS — N6452 Nipple discharge: Secondary | ICD-10-CM

## 2017-02-22 ENCOUNTER — Ambulatory Visit: Payer: Self-pay

## 2017-02-22 ENCOUNTER — Other Ambulatory Visit: Payer: Self-pay | Admitting: Occupational Medicine

## 2017-02-22 DIAGNOSIS — M79641 Pain in right hand: Secondary | ICD-10-CM

## 2017-03-15 ENCOUNTER — Other Ambulatory Visit: Payer: Self-pay | Admitting: Family Medicine

## 2017-03-15 DIAGNOSIS — Z1231 Encounter for screening mammogram for malignant neoplasm of breast: Secondary | ICD-10-CM

## 2017-05-24 ENCOUNTER — Ambulatory Visit
Admission: RE | Admit: 2017-05-24 | Discharge: 2017-05-24 | Disposition: A | Discharge status: Home / Self Care | Payer: 59 | Source: Ambulatory Visit | Attending: Family Medicine | Admitting: Family Medicine

## 2017-05-24 DIAGNOSIS — Z1231 Encounter for screening mammogram for malignant neoplasm of breast: Secondary | ICD-10-CM

## 2018-02-26 ENCOUNTER — Ambulatory Visit: Payer: Self-pay | Admitting: Physician Assistant

## 2018-02-26 VITALS — BP 128/86 | HR 77 | Temp 99.7°F | Resp 18 | Wt 196.2 lb

## 2018-02-26 DIAGNOSIS — J40 Bronchitis, not specified as acute or chronic: Secondary | ICD-10-CM

## 2018-02-26 MED ORDER — BENZONATATE 100 MG PO CAPS: 100.0000 mg | ORAL_CAPSULE | Freq: Three times a day (TID) | ORAL | 0 refills | Status: DC | PRN

## 2018-02-26 MED ORDER — GUAIFENESIN ER 1200 MG PO TB12: 1.0000 | ORAL_TABLET | Freq: Two times a day (BID) | ORAL | 1 refills | Status: AC | PRN

## 2018-02-26 MED ORDER — ALBUTEROL SULFATE HFA 108 (90 BASE) MCG/ACT IN AERS: 2.0000 | INHALATION_SPRAY | RESPIRATORY_TRACT | 0 refills | Status: DC | PRN

## 2018-02-26 MED ORDER — PROMETHAZINE-DM 6.25-15 MG/5ML PO SYRP: 5.0000 mL | ORAL_SOLUTION | Freq: Four times a day (QID) | ORAL | 0 refills | Status: DC | PRN

## 2018-02-26 NOTE — Progress Notes (Signed)
MRN: [id number] DOB: 1965-10-11  Subjective:   Morgan Hooper is a 52 y.o. female presenting for chief complaint of self pay-cough, headache, body achesx4 .  Reports 4 day history of illness. Started out with head cold then settled into her chest a couple days ago. Now having dry barking cough. Has some wheezing at night time, typically uses an albuterol inhaler if this happens but she is out of hers.  Denies fever, chills, sore throat, sinus pain, nasal congestion, SOB, chest pain. Has tried OTC cough syrup with no full relief.  Has not taken anything today.  PMH of asthma as a child.  Patient has had flu shot this season. Denies smoking.  Denies any other aggravating or relieving factors, no other questions or concerns.  Review of Systems  Constitutional: Negative for chills and diaphoresis.  Respiratory: Negative for hemoptysis and sputum production.   Gastrointestinal: Negative for abdominal pain.  Neurological: Negative for dizziness and headaches.    Morgan Hooper has a current medication list which includes the following prescription(s): alprazolam, esomeprazole, atorvastatin, bupropion, cholecalciferol, docusate sodium, hydrocodone-acetaminophen, ibuprofen, mometasone, oxycodone-acetaminophen, propranolol, and topiramate. Also is allergic to augmentin [amoxicillin-pot clavulanate]; lotrel [amlodipine besy-benazepril hcl]; paxil [paroxetine hcl]; prednisone; bactrim [sulfamethoxazole-trimethoprim]; keflex [cephalexin]; and levaquin [levofloxacin].  Morgan Hooper  has a past medical history of Asthma, Depression, GERD (gastroesophageal reflux disease), Headache, History of hiatal hernia, and Vaginal delivery (1988, 1993). Also  has a past surgical history that includes Cholecystectomy; Tympanostomy tube placement; Lymph node biopsy; removal of neck tumor; Diagnostic laparoscopy; Diagnostic laparoscopy; Mandible reconstruction; Laparoscopic assisted vaginal hysterectomy (N/A, 08/20/2014); Laparoscopic  salpingo oophorectomy (Bilateral, 08/20/2014); Unilateral salpingectomy (Left, 08/20/2014); and Breast biopsy (Left).   Objective:   Vitals: BP 128/86 (BP Location: Right Arm, Patient Position: Sitting, Cuff Size: Normal)   Pulse 77   Temp 99.7 F (37.6 C) (Oral)   Resp 18   Wt 196 lb 3.2 oz (89 kg)   SpO2 97%   BMI 37.07 kg/m   Physical Exam Vitals signs reviewed.  Constitutional:      General: She is not in acute distress.    Appearance: She is well-developed. She is not toxic-appearing.  HENT:     Head: Normocephalic and atraumatic.     Right Ear: Tympanic membrane, ear canal and external ear normal.     Left Ear: Tympanic membrane, ear canal and external ear normal.     Nose: Nose normal. No mucosal edema.     Right Sinus: No maxillary sinus tenderness or frontal sinus tenderness.     Left Sinus: No maxillary sinus tenderness or frontal sinus tenderness.     Mouth/Throat:     Lips: Pink.     Mouth: Mucous membranes are moist.     Pharynx: Oropharynx is clear. Uvula midline. No posterior oropharyngeal erythema.     Tonsils: No tonsillar exudate or tonsillar abscesses.  Eyes:     Conjunctiva/sclera: Conjunctivae normal.  Neck:     Musculoskeletal: Normal range of motion.  Cardiovascular:     Rate and Rhythm: Normal rate and regular rhythm.     Heart sounds: Normal heart sounds.  Pulmonary:     Effort: Pulmonary effort is normal.     Breath sounds: Normal breath sounds. No decreased breath sounds, wheezing, rhonchi or rales.     Comments: Frequent coughing noted during exam.  Lymphadenopathy:     Head:     Right side of head: No submental, submandibular, tonsillar, preauricular, posterior auricular or occipital adenopathy.  Left side of head: No submental, submandibular, tonsillar, preauricular, posterior auricular or occipital adenopathy.     Cervical: No cervical adenopathy.     Upper Body:     Right upper body: No supraclavicular adenopathy.     Left upper body:  No supraclavicular adenopathy.  Skin:    General: Skin is warm and dry.  Neurological:     Mental Status: She is alert and oriented to person, place, and time.     No results found for this or any previous visit (from the past 24 hour(s)).  Assessment and Plan :  1. Bronchitis Patient is overall well-appearing, no acute distress.  VSS.  Lungs CTAB.  Consistent with viral bronchitis.  Advised supportive care, offered symptomatic relief.  No red flags noted.  Encourage patient to follow-up in 5 to 7 days if no improvement with treatment plan.  Seek care at our office, local urgent care/ED, or family doctor if symptoms worsen/develop new concerning symptoms. - promethazine-dextromethorphan (PROMETHAZINE-DM) 6.25-15 MG/5ML syrup; Take 5 mLs by mouth 4 (four) times daily as needed for cough.  Dispense: 118 mL; Refill: 0 - Guaifenesin (MUCINEX MAXIMUM STRENGTH) 1200 MG TB12; Take 1 tablet (1,200 mg total) by mouth every 12 (twelve) hours as needed.  Dispense: 14 tablet; Refill: 1 - benzonatate (TESSALON) 100 MG capsule; Take 1-2 capsules (100-200 mg total) by mouth 3 (three) times daily as needed for cough.  Dispense: 40 capsule; Refill: 0 - albuterol (PROVENTIL HFA;VENTOLIN HFA) 108 (90 Base) MCG/ACT inhaler; Inhale 2 puffs into the lungs every 4 (four) hours as needed for wheezing or shortness of breath (cough, shortness of breath or wheezing.).  Dispense: 1 Inhaler; Refill: 0    Tenna Delaine, [facility] 02/26/2018 1:05 PM

## 2018-02-26 NOTE — Patient Instructions (Signed)
Acute Bronchitis, Adult  You may use Tessalon Perles during the day to stop the cough.  Mucinex will help thin mucus and cleared out.  He can use a prescription cough syrup at night, may cause drowsiness.  Use albuterol inhaler every 4-6 hours as needed for wheezing.  If no improvement with treatment plan in 5 to 7 days, please contact our office.  If any of your symptoms worsen or you develop new concerning symptoms please return to office for further evaluation or see your family doctor.    Acute bronchitis is when air tubes (bronchi) in the lungs suddenly get swollen. The condition can make it hard to breathe. It can also cause these symptoms:  A cough.  Coughing up clear, yellow, or green mucus.  Wheezing.  Chest congestion.  Shortness of breath.  A fever.  Body aches.  Chills.  A sore throat. Follow these instructions at home:  Medicines  Take over-the-counter and prescription medicines only as told by your doctor.  If you were prescribed an antibiotic medicine, take it as told by your doctor. Do not stop taking the antibiotic even if you start to feel better. General instructions  Rest.  Drink enough fluids to keep your pee (urine) pale yellow.  Avoid smoking and secondhand smoke. If you smoke and you need help quitting, ask your doctor. Quitting will help your lungs heal faster.  Use an inhaler, cool mist vaporizer, or humidifier as told by your doctor.  Keep all follow-up visits as told by your doctor. This is important. How is this prevented? To lower your risk of getting this condition again:  Wash your hands often with soap and water. If you cannot use soap and water, use hand sanitizer.  Avoid contact with people who have cold symptoms.  Try not to touch your hands to your mouth, nose, or eyes.  Make sure to get the flu shot every year. Contact a doctor if:  Your symptoms do not get better in 2 weeks. Get help right away if:  You cough up  blood.  You have chest pain.  You have very bad shortness of breath.  You become dehydrated.  You faint (pass out) or keep feeling like you are going to pass out.  You keep throwing up (vomiting).  You have a very bad headache.  Your fever or chills gets worse. This information is not intended to replace advice given to you by your health care provider. Make sure you discuss any questions you have with your health care provider. Document Released: 08/03/2007 Document Revised: 09/28/2016 Document Reviewed: 08/05/2015 Elsevier Interactive Patient Education  2019 [employer].

## 2018-02-28 ENCOUNTER — Other Ambulatory Visit: Payer: Self-pay | Admitting: Physician Assistant

## 2018-02-28 MED ORDER — PSEUDOEPH-BROMPHEN-DM 30-2-10 MG/5ML PO SYRP: 5.0000 mL | ORAL_SOLUTION | Freq: Two times a day (BID) | ORAL | 0 refills | Status: DC | PRN

## 2018-02-28 NOTE — Progress Notes (Signed)
Original refill for Promethazine DM was on backorder.  Sent in new prescription for cough syrup.

## 2018-03-09 ENCOUNTER — Emergency Department (HOSPITAL_COMMUNITY): Payer: Self-pay

## 2018-03-09 ENCOUNTER — Encounter (HOSPITAL_COMMUNITY): Payer: Self-pay | Admitting: *Deleted

## 2018-03-09 ENCOUNTER — Other Ambulatory Visit: Payer: Self-pay

## 2018-03-09 ENCOUNTER — Observation Stay (HOSPITAL_COMMUNITY)
Admission: EM | Admit: 2018-03-09 | Discharge: 2018-03-11 | Disposition: A | Discharge status: Home / Self Care | Payer: Self-pay | Attending: Internal Medicine | Admitting: Internal Medicine

## 2018-03-09 DIAGNOSIS — J4521 Mild intermittent asthma with (acute) exacerbation: Principal | ICD-10-CM | POA: Insufficient documentation

## 2018-03-09 DIAGNOSIS — Z888 Allergy status to other drugs, medicaments and biological substances status: Secondary | ICD-10-CM | POA: Insufficient documentation

## 2018-03-09 DIAGNOSIS — Z9049 Acquired absence of other specified parts of digestive tract: Secondary | ICD-10-CM | POA: Insufficient documentation

## 2018-03-09 DIAGNOSIS — F329 Major depressive disorder, single episode, unspecified: Secondary | ICD-10-CM | POA: Insufficient documentation

## 2018-03-09 DIAGNOSIS — D72829 Elevated white blood cell count, unspecified: Secondary | ICD-10-CM | POA: Insufficient documentation

## 2018-03-09 DIAGNOSIS — R918 Other nonspecific abnormal finding of lung field: Secondary | ICD-10-CM | POA: Insufficient documentation

## 2018-03-09 DIAGNOSIS — Z880 Allergy status to penicillin: Secondary | ICD-10-CM | POA: Insufficient documentation

## 2018-03-09 DIAGNOSIS — Z881 Allergy status to other antibiotic agents status: Secondary | ICD-10-CM | POA: Insufficient documentation

## 2018-03-09 DIAGNOSIS — Z79899 Other long term (current) drug therapy: Secondary | ICD-10-CM | POA: Insufficient documentation

## 2018-03-09 DIAGNOSIS — K219 Gastro-esophageal reflux disease without esophagitis: Secondary | ICD-10-CM | POA: Insufficient documentation

## 2018-03-09 DIAGNOSIS — J40 Bronchitis, not specified as acute or chronic: Secondary | ICD-10-CM

## 2018-03-09 DIAGNOSIS — J45901 Unspecified asthma with (acute) exacerbation: Secondary | ICD-10-CM | POA: Diagnosis present

## 2018-03-09 MED ORDER — ALBUTEROL SULFATE (2.5 MG/3ML) 0.083% IN NEBU
5.0000 mg | INHALATION_SOLUTION | Freq: Once | RESPIRATORY_TRACT | Status: AC
  Administered 2018-03-09: 5 mg via RESPIRATORY_TRACT
  Filled 2018-03-09: qty 6

## 2018-03-09 NOTE — ED Triage Notes (Signed)
Pt has been having SOB since 12/29. Was seen and told it was too late to take tamiflu. Pt have been taking Mucinex, day and nyquil, and using her inhaler at home q 4 hours. Pt noted to be wheezing in L lung, clear on the right. Having a nonproductive cough.

## 2018-03-10 ENCOUNTER — Encounter (HOSPITAL_COMMUNITY): Payer: Self-pay | Admitting: Internal Medicine

## 2018-03-10 ENCOUNTER — Other Ambulatory Visit: Payer: Self-pay

## 2018-03-10 DIAGNOSIS — J4521 Mild intermittent asthma with (acute) exacerbation: Secondary | ICD-10-CM

## 2018-03-10 DIAGNOSIS — J45901 Unspecified asthma with (acute) exacerbation: Secondary | ICD-10-CM | POA: Diagnosis present

## 2018-03-10 LAB — BASIC METABOLIC PANEL
ANION GAP: 15 (ref 5–15)
BUN: 6 mg/dL (ref 6–20)
CALCIUM: 8.8 mg/dL — AB (ref 8.9–10.3)
CO2: 17 mmol/L — ABNORMAL LOW (ref 22–32)
Chloride: 105 mmol/L (ref 98–111)
Creatinine, Ser: 1.01 mg/dL — ABNORMAL HIGH (ref 0.44–1.00)
GFR calc Af Amer: >60 mL/min (ref >60)
GLUCOSE: 142 mg/dL — AB (ref 70–99)
Potassium: 3.7 mmol/L (ref 3.5–5.1)
SODIUM: 137 mmol/L (ref 135–145)

## 2018-03-10 LAB — CBC WITH DIFFERENTIAL/PLATELET
Abs Immature Granulocytes: 0.1 10*3/uL — ABNORMAL HIGH (ref 0.00–0.07)
BASOS ABS: 0.1 10*3/uL (ref 0.0–0.1)
BASOS PCT: 1 %
EOS ABS: 0.7 10*3/uL — AB (ref 0.0–0.5)
Eosinophils Relative: 6 %
HCT: 46.4 % — ABNORMAL HIGH (ref 36.0–46.0)
Hemoglobin: 14.5 g/dL (ref 12.0–15.0)
IMMATURE GRANULOCYTES: 1 %
Lymphocytes Relative: 17 %
Lymphs Abs: 2 10*3/uL (ref 0.7–4.0)
MCH: 30.3 pg (ref 26.0–34.0)
MCHC: 31.3 g/dL (ref 30.0–36.0)
MCV: 97.1 fL (ref 80.0–100.0)
MONOS PCT: 7 %
Monocytes Absolute: 0.8 10*3/uL (ref 0.1–1.0)
NEUTROS ABS: 8.1 10*3/uL — AB (ref 1.7–7.7)
NRBC: 0 % (ref 0.0–0.2)
Neutrophils Relative %: 68 %
PLATELETS: 268 10*3/uL (ref 150–400)
RBC: 4.78 MIL/uL (ref 3.87–5.11)
RDW: 12.1 % (ref 11.5–15.5)
WBC: 11.8 10*3/uL — AB (ref 4.0–10.5)

## 2018-03-10 MED ORDER — ACETAMINOPHEN 325 MG PO TABS
650.0000 mg | ORAL_TABLET | Freq: Four times a day (QID) | ORAL | Status: DC | PRN
  Administered 2018-03-10 – 2018-03-11 (×3): 650 mg via ORAL
  Filled 2018-03-10 (×3): qty 2

## 2018-03-10 MED ORDER — DOXYCYCLINE HYCLATE 100 MG PO TABS
100.0000 mg | ORAL_TABLET | Freq: Two times a day (BID) | ORAL | Status: DC
  Administered 2018-03-10 – 2018-03-11 (×3): 100 mg via ORAL
  Filled 2018-03-10 (×3): qty 1

## 2018-03-10 MED ORDER — IPRATROPIUM BROMIDE 0.02 % IN SOLN
0.5000 mg | RESPIRATORY_TRACT | Status: DC
  Administered 2018-03-10: 0.5 mg via RESPIRATORY_TRACT
  Filled 2018-03-10: qty 2.5

## 2018-03-10 MED ORDER — IPRATROPIUM-ALBUTEROL 0.5-2.5 (3) MG/3ML IN SOLN
3.0000 mL | Freq: Four times a day (QID) | RESPIRATORY_TRACT | Status: DC
  Administered 2018-03-10 – 2018-03-11 (×5): 3 mL via RESPIRATORY_TRACT
  Filled 2018-03-10 (×5): qty 3

## 2018-03-10 MED ORDER — ACETAMINOPHEN 650 MG RE SUPP: 650.0000 mg | Freq: Four times a day (QID) | RECTAL | Status: DC | PRN

## 2018-03-10 MED ORDER — BUDESONIDE 0.5 MG/2ML IN SUSP
2.0000 mg | Freq: Four times a day (QID) | RESPIRATORY_TRACT | Status: DC
  Administered 2018-03-10 – 2018-03-11 (×3): 2 mg via RESPIRATORY_TRACT
  Filled 2018-03-10 (×3): qty 8

## 2018-03-10 MED ORDER — ALBUTEROL (5 MG/ML) CONTINUOUS INHALATION SOLN
10.0000 mg/h | INHALATION_SOLUTION | Freq: Once | RESPIRATORY_TRACT | Status: AC
  Administered 2018-03-10: 10 mg/h via RESPIRATORY_TRACT
  Filled 2018-03-10: qty 20

## 2018-03-10 MED ORDER — METHYLPREDNISOLONE SODIUM SUCC 125 MG IJ SOLR
125.0000 mg | Freq: Once | INTRAMUSCULAR | Status: AC
  Administered 2018-03-10: 125 mg via INTRAVENOUS
  Filled 2018-03-10: qty 2

## 2018-03-10 MED ORDER — ALPRAZOLAM 0.5 MG PO TABS
2.0000 mg | ORAL_TABLET | Freq: Every day | ORAL | Status: DC
  Administered 2018-03-10: 2 mg via ORAL
  Filled 2018-03-10: qty 4

## 2018-03-10 MED ORDER — IPRATROPIUM BROMIDE 0.02 % IN SOLN: 0.5000 mg | Freq: Four times a day (QID) | RESPIRATORY_TRACT | Status: DC

## 2018-03-10 MED ORDER — LEVALBUTEROL HCL 1.25 MG/0.5ML IN NEBU: 1.2500 mg | INHALATION_SOLUTION | Freq: Four times a day (QID) | RESPIRATORY_TRACT | Status: DC

## 2018-03-10 MED ORDER — IPRATROPIUM BROMIDE 0.02 % IN SOLN
0.5000 mg | Freq: Once | RESPIRATORY_TRACT | Status: AC
  Administered 2018-03-10: 0.5 mg via RESPIRATORY_TRACT
  Filled 2018-03-10: qty 2.5

## 2018-03-10 MED ORDER — ACETAMINOPHEN 500 MG PO TABS
1000.0000 mg | ORAL_TABLET | Freq: Once | ORAL | Status: AC
  Administered 2018-03-10: 1000 mg via ORAL
  Filled 2018-03-10: qty 2

## 2018-03-10 MED ORDER — MAGNESIUM SULFATE 2 GM/50ML IV SOLN
2.0000 g | Freq: Once | INTRAVENOUS | Status: AC
  Administered 2018-03-10: 2 g via INTRAVENOUS
  Filled 2018-03-10: qty 50

## 2018-03-10 MED ORDER — ENOXAPARIN SODIUM 40 MG/0.4ML ~~LOC~~ SOLN
40.0000 mg | SUBCUTANEOUS | Status: DC
  Administered 2018-03-10 – 2018-03-11 (×2): 40 mg via SUBCUTANEOUS
  Filled 2018-03-10 (×2): qty 0.4

## 2018-03-10 MED ORDER — GUAIFENESIN 100 MG/5ML PO SOLN
10.0000 mL | ORAL | Status: DC | PRN
  Administered 2018-03-10: 200 mg via ORAL
  Filled 2018-03-10: qty 10

## 2018-03-10 MED ORDER — ALBUTEROL SULFATE (2.5 MG/3ML) 0.083% IN NEBU: 2.5000 mg | INHALATION_SOLUTION | RESPIRATORY_TRACT | Status: DC | PRN

## 2018-03-10 MED ORDER — GUAIFENESIN ER 600 MG PO TB12
600.0000 mg | ORAL_TABLET | Freq: Two times a day (BID) | ORAL | Status: DC
  Administered 2018-03-10 – 2018-03-11 (×3): 600 mg via ORAL
  Filled 2018-03-10 (×3): qty 1

## 2018-03-10 MED ORDER — METHYLPREDNISOLONE SODIUM SUCC 40 MG IJ SOLR
40.0000 mg | Freq: Four times a day (QID) | INTRAMUSCULAR | Status: DC
  Administered 2018-03-10 – 2018-03-11 (×6): 40 mg via INTRAVENOUS
  Filled 2018-03-10 (×6): qty 1

## 2018-03-10 NOTE — ED Notes (Signed)
Pt c/o severe headache; continues to c/o wheezing

## 2018-03-10 NOTE — Progress Notes (Signed)
Peak flow assessed pre and post svn.  Pre svn peak flow 225 with good effort, post svn peak flow 225 with good effort.

## 2018-03-10 NOTE — H&P (Addendum)
History and Physical    Morgan Hooper:[id number] DOB: 1965-07-21 DOA: 03/09/2018  PCP: Morgan Contras, MD  Patient coming from: Home.  I have personally briefly reviewed patient's old medical records in Epic and Chart everywhere.   Chief Complaint: Shortness of breath and wheezing.  HPI: Morgan Hooper is a 53 y.o. female with medical history significant of mild intermittent asthma, GERD who presents to the emergency department with about 2 weeks history of cough cold upper respiratory symptoms and wheezing.  According to the patient, after Christmas she started developing cough cold and congestion.  She went to urgent care next day, she was diagnosed with influenza B and was given symptomatic treatment with cough medications.  She continue to take those medications but they did not help.  Patient continued to have worsening wheezing, coughing with any activities including walking.  She has minimal mucoid sputum production.  Denies any fever or chills.  Currently denies any runny nose, denies any headache, nausea, vomiting.  No chest pain.  Denies any dizziness or lightheadedness.  No abdominal pain.  Bowel and urine habits are normal. ED Course: In the emergency room, patient was hemodynamically stable.  She was on room air.  Patient was given a dose of Solu-Medrol 125 mg, magnesium sulfate 2 g as well as 2 rounds of bronchodilator therapy.  Due to significant symptoms, advised admission and treatment in the hospital.  Review of Systems: As per HPI otherwise 10 point review of systems negative.    Past Medical History:  Diagnosis Date  . Asthma    uses albuterol   . Depression    takes wellbutrin   . GERD (gastroesophageal reflux disease)    takes Nexium   . Headache    takes topamax and Inderal   . History of hiatal hernia   . Vaginal delivery 1988, 1993    Past Surgical History:  Procedure Laterality Date  . BREAST BIOPSY Left    benign stero  . CHOLECYSTECTOMY    .  DIAGNOSTIC LAPAROSCOPY     ectopic pregnancy took right fallopian tube in 1991  . DIAGNOSTIC LAPAROSCOPY     removal of cyst on right ovary   . LAPAROSCOPIC ASSISTED VAGINAL HYSTERECTOMY N/A 08/20/2014   Procedure: LAPAROSCOPIC ASSISTED VAGINAL HYSTERECTOMY;  Surgeon: [name], DO;  Location: [facility];  Service: Gynecology;  Laterality: N/A;  . LAPAROSCOPIC SALPINGO OOPHERECTOMY Bilateral 08/20/2014   Procedure: LAPAROSCOPIC OOPHORECTOMY;  Surgeon: [name], DO;  Location: [facility];  Service: Gynecology;  Laterality: Bilateral;  . LYMPH NODE BIOPSY    . MANDIBLE RECONSTRUCTION    . removal of neck tumor    . TYMPANOSTOMY TUBE PLACEMENT     as a child   . UNILATERAL SALPINGECTOMY Left 08/20/2014   Procedure: UNILATERAL SALPINGECTOMY;  Surgeon: [name], DO;  Location: [facility];  Service: Gynecology;  Laterality: Left;     reports that she has never smoked. She has never used smokeless tobacco. She reports current alcohol use. No history on file for drug.  Allergies  Allergen Reactions  . Augmentin [Amoxicillin-Pot Clavulanate] Diarrhea and Nausea Only    DID THE REACTION INVOLVE: Swelling of the face/tongue/throat, SOB, or low BP? No Sudden or severe rash/hives, skin peeling, or the inside of the mouth or nose? No Did it require medical treatment? No When did it last happen?less than 5 years If all above answers are "NO", may proceed with cephalosporin use.   Morgan Hooper [Amlodipine Besy-Benazepril Hcl] Cough  .  Paxil [Paroxetine Hcl] Other (See Comments)    Causes daze feeling  . Prednisone Other (See Comments)    Cannot tolerate oral but injection is ok  . Bactrim [Sulfamethoxazole-Trimethoprim] Rash  . Keflex [Cephalexin] Rash  . Levaquin [Levofloxacin] Rash    History reviewed. No pertinent family history.   Prior to Admission medications   Medication Sig Start Date End Date Taking? Authorizing Provider  ALPRAZolam Morgan Hooper) 1 MG tablet Take 2 mg by mouth at  bedtime.    Yes [provider]  estradiol (CLIMARA - DOSED IN MG/24 HR) 0.0375 mg/24hr patch Place 0.0375 mg onto the skin See admin instructions. Apply 1 patch every 4 days. (discard old patch)   Yes [provider]  albuterol (PROVENTIL HFA;VENTOLIN HFA) 108 (90 Base) MCG/ACT inhaler Inhale 2 puffs into the lungs every 4 (four) hours as needed for wheezing or shortness of breath (cough, shortness of breath or wheezing.). Patient not taking: Reported on 03/10/2018 02/26/18   Tenna Delaine D, PA-C  Guaifenesin [facility] MAXIMUM STRENGTH) 1200 MG TB12 Take 1 tablet (1,200 mg total) by mouth every 12 (twelve) hours as needed. Patient not taking: Reported on 03/10/2018 02/26/18   Morgan Douglas, PA-C    Physical Exam: Vitals:   03/10/18 0700 03/10/18 0726 03/10/18 0730 03/10/18 0758  BP: (!) 157/86   (!) 170/87  Hooper: (!) 112 (!) 102 97 (!) 103  Resp:    18  Temp:    97.6 F (36.4 C)  TempSrc:    Oral  SpO2: 94% 98% 99% 95%    Constitutional: NAD, calm, comfortable Vitals:   03/10/18 0700 03/10/18 0726 03/10/18 0730 03/10/18 0758  BP: (!) 157/86   (!) 170/87  Hooper: (!) 112 (!) 102 97 (!) 103  Resp:    18  Temp:    97.6 F (36.4 C)  TempSrc:    Oral  SpO2: 94% 98% 99% 95%   Eyes: PERRL, lids and conjunctivae normal ENMT: Mucous membranes are moist. Posterior pharynx clear of any exudate or lesions.Normal dentition.  Neck: normal, supple, no masses, no thyromegaly Respiratory: Bilateral inspiratory and expiratory wheezing, no crackles.  Poor air entry.  Nagging cough.  Normal respiratory effort. No accessory muscle use.  Cardiovascular: Regular rate and rhythm, no murmurs / rubs / gallops. No extremity edema. 2+ pedal pulses. No carotid bruits.  Abdomen: no tenderness, no masses palpated. No hepatosplenomegaly. Bowel sounds positive.  Musculoskeletal: no clubbing / cyanosis. No joint deformity upper and lower extremities. Good ROM, no contractures. Normal  muscle tone.  Skin: no rashes, lesions, ulcers. No induration Neurologic: CN 2-12 grossly intact. Sensation intact, DTR normal. Strength 5/5 in all 4.  Psychiatric: Normal judgment and insight. Alert and oriented x 3. Normal mood.   Labs on Admission: I have personally reviewed following labs and imaging studies  CBC: Recent Labs  Lab 03/10/18 0533  WBC 11.8*  NEUTROABS 8.1*  HGB 14.5  HCT 46.4*  MCV 97.1  PLT 347   Basic Metabolic Panel: Recent Labs  Lab 03/10/18 0533  NA 137  K 3.7  CL 105  CO2 17*  GLUCOSE 142*  BUN 6  CREATININE 1.01*  CALCIUM 8.8*   GFR: CrCl cannot be calculated (Unknown ideal weight.). Liver Function Tests: No results for input(s): AST, ALT, ALKPHOS, BILITOT, PROT, ALBUMIN in the last 168 hours. No results for input(s): LIPASE, AMYLASE in the last 168 hours. No results for input(s): AMMONIA in the last 168 hours. Coagulation Profile: No results for  input(s): INR, PROTIME in the last 168 hours. Cardiac Enzymes: No results for input(s): CKTOTAL, CKMB, CKMBINDEX, TROPONINI in the last 168 hours. BNP (last 3 results) No results for input(s): PROBNP in the last 8760 hours. HbA1C: No results for input(s): HGBA1C in the last 72 hours. CBG: No results for input(s): GLUCAP in the last 168 hours. Lipid Profile: No results for input(s): CHOL, HDL, LDLCALC, TRIG, CHOLHDL, LDLDIRECT in the last 72 hours. Thyroid Function Tests: No results for input(s): TSH, T4TOTAL, FREET4, T3FREE, THYROIDAB in the last 72 hours. Anemia Panel: No results for input(s): VITAMINB12, FOLATE, FERRITIN, TIBC, IRON, RETICCTPCT in the last 72 hours. Urine analysis:    Component Value Date/Time   COLORURINE YELLOW 07/15/2010 0324   APPEARANCEUR CLEAR 07/15/2010 0324   LABSPEC 1.010 07/15/2010 0324   PHURINE 7.0 07/15/2010 0324   GLUCOSEU NEGATIVE 07/15/2010 0324   HGBUR TRACE (A) 07/15/2010 0324   BILIRUBINUR NEGATIVE 07/15/2010 0324   KETONESUR NEGATIVE 07/15/2010  0324   PROTEINUR NEGATIVE 07/15/2010 0324   UROBILINOGEN 1.0 07/15/2010 0324   NITRITE NEGATIVE 07/15/2010 0324   LEUKOCYTESUR NEGATIVE 07/15/2010 0324    Radiological Exams on Admission: Dg Chest 2 View  Result Date: 03/09/2018 CLINICAL DATA:  53 y/o F; flu like symptoms, shortness of breath, productive cough, chest pain, congestion. EXAM: CHEST - 2 VIEW COMPARISON:  03/31/2008 chest radiograph. FINDINGS: Normal stable cardiac silhouette. Increased central reticular opacities. No consolidation, effusion, or pneumothorax. Bones are unremarkable. IMPRESSION: Increased central reticular opacities may represent bronchitis, reactive airways disease, or atypical pneumonia. No consolidation. Electronically Signed   By: Kristine Garbe M.D.   On: 03/09/2018 23:23     Assessment/Plan Principal Problem:   Asthma exacerbation Active Problems:   Asthma exacerbation attacks   Acute asthma exacerbation with history of mild intermittent asthma: Agree with admission given severity of symptoms. We will continue patient on IV steroids, aggressive bronchodilator therapy, inhalational steroids.  Given abnormal chest x-ray and persistent symptoms for more than 2 weeks, will treat with 7 days of doxycycline therapy. Peak flow meter after each treatment. Cough medications.  Oxygen therapy, deep breathing exercises.  Mild intermittent asthma: Patient did not have any exacerbation for last 20 years.  She is on maintenance albuterol at home.  Not on any steroid inhalers.  She will need steroid inhalers on discharge because of exacerbation.  DVT prophylaxis: lovenox  Code Status: Full code. Family Communication: Patient Able of medical decisions. Disposition Plan: Home. Consults called: None. Admission status: observation.    Barb Merino MD [facility] Pager [phone]  If 7PM-7AM, please contact night-coverage [email or web address] Password [email or web address]  03/10/2018, 9:00 AM

## 2018-03-10 NOTE — ED Provider Notes (Signed)
[facility] EMERGENCY DEPARTMENT Provider Note   CSN: [id number] Arrival date & time: 03/09/18  2152     History   Chief Complaint Chief Complaint  Patient presents with  . Shortness of Breath    HPI Morgan Hooper is a 53 y.o. female.  Patient presents to the emergency department for evaluation of shortness of breath.  Patient reports that she became ill on December 29.  She started with cough and congestion.  She was seen by her primary doctor the next day and given cough medicine.  She reports that symptoms worsen then she was seen again on January 2.  At that time she was given a shot of steroid and has been using her inhaler every 4 hours with NyQuil.  She continues to have shortness of breath wheezing, cough productive of thick sputum.     Past Medical History:  Diagnosis Date  . Asthma    uses albuterol   . Depression    takes wellbutrin   . GERD (gastroesophageal reflux disease)    takes Nexium   . Headache    takes topamax and Inderal   . History of hiatal hernia   . Vaginal delivery 1988, 1993    Patient Active Problem List   Diagnosis Date Noted  . Chronic pelvic pain in female 08/20/2014    Past Surgical History:  Procedure Laterality Date  . BREAST BIOPSY Left    benign stero  . CHOLECYSTECTOMY    . DIAGNOSTIC LAPAROSCOPY     ectopic pregnancy took right fallopian tube in 1991  . DIAGNOSTIC LAPAROSCOPY     removal of cyst on right ovary   . LAPAROSCOPIC ASSISTED VAGINAL HYSTERECTOMY N/A 08/20/2014   Procedure: LAPAROSCOPIC ASSISTED VAGINAL HYSTERECTOMY;  Surgeon: [name], DO;  Location: [facility];  Service: Gynecology;  Laterality: N/A;  . LAPAROSCOPIC SALPINGO OOPHERECTOMY Bilateral 08/20/2014   Procedure: LAPAROSCOPIC OOPHORECTOMY;  Surgeon: [name], DO;  Location: [facility];  Service: Gynecology;  Laterality: Bilateral;  . LYMPH NODE BIOPSY    . MANDIBLE RECONSTRUCTION    . removal of neck tumor    . TYMPANOSTOMY TUBE  PLACEMENT     as a child   . UNILATERAL SALPINGECTOMY Left 08/20/2014   Procedure: UNILATERAL SALPINGECTOMY;  Surgeon: [name], DO;  Location: [facility];  Service: Gynecology;  Laterality: Left;     OB History   No obstetric history on file.      Home Medications    Prior to Admission medications   Medication Sig Start Date End Date Taking? Authorizing Provider  albuterol (PROVENTIL HFA;VENTOLIN HFA) 108 (90 Base) MCG/ACT inhaler Inhale 2 puffs into the lungs every 4 (four) hours as needed for wheezing or shortness of breath (cough, shortness of breath or wheezing.). 02/26/18   Tenna Delaine D, PA-C  ALPRAZolam Duanne Moron) 1 MG tablet Take 1 mg by mouth 2 (two) times daily as needed for anxiety or sleep.     [provider]  atorvastatin (LIPITOR) 40 MG tablet Take 40 mg by mouth daily at 6 PM.     [provider]  benzonatate (TESSALON) 100 MG capsule Take 1-2 capsules (100-200 mg total) by mouth 3 (three) times daily as needed for cough. 02/26/18   Tenna Delaine D, PA-C  brompheniramine-pseudoephedrine-DM 30-2-10 MG/5ML syrup Take 5 mLs by mouth 2 (two) times daily as needed. 02/28/18   Tenna Delaine D, PA-C  buPROPion (WELLBUTRIN XL) 300 MG 24 hr tablet Take 300 mg by mouth daily.  [provider]  cholecalciferol (VITAMIN D) 1000 UNITS tablet Take 2,000 Units by mouth 2 (two) times daily.    [provider]  docusate sodium (COLACE) 100 MG capsule Take 1 capsule (100 mg total) by mouth 2 (two) times daily. Patient not taking: Reported on 02/26/2018 08/20/14   [name], DO  esomeprazole (NEXIUM) 40 MG capsule Take 40 mg by mouth 2 (two) times daily before a meal.     [provider]  Guaifenesin (MUCINEX MAXIMUM STRENGTH) 1200 MG TB12 Take 1 tablet (1,200 mg total) by mouth every 12 (twelve) hours as needed. 02/26/18   Tenna Delaine D, PA-C  HYDROcodone-acetaminophen (NORCO/VICODIN) 5-325 MG per tablet Take 1 tablet by  mouth every 6 (six) hours as needed for moderate pain.    [provider]  ibuprofen (ADVIL,MOTRIN) 600 MG tablet Take 1 tablet (600 mg total) by mouth every 6 (six) hours as needed for mild pain or moderate pain. Patient not taking: Reported on 02/26/2018 08/21/14   [name], DO  mometasone (NASONEX) 50 MCG/ACT nasal spray Place 2 sprays into the nose daily.    [provider]  oxyCODONE-acetaminophen (PERCOCET/ROXICET) 5-325 MG per tablet Take 1 tablet by mouth every 6 (six) hours as needed (moderate to severe pain (when tolerating fluids)). Patient not taking: Reported on 02/26/2018 08/21/14   [name], DO  promethazine-dextromethorphan (PROMETHAZINE-DM) 6.25-15 MG/5ML syrup Take 5 mLs by mouth 4 (four) times daily as needed for cough. 02/26/18   Tenna Delaine D, PA-C  propranolol (INDERAL) 60 MG tablet Take 60 mg by mouth 2 (two) times daily.     [provider]  topiramate (TOPAMAX) 100 MG tablet Take 200 mg by mouth 2 (two) times daily.     [provider]    Family History No family history on file.  Social History Social History   Tobacco Use  . Smoking status: Never Smoker  . Smokeless tobacco: Never Used  Substance Use Topics  . Alcohol use: Yes    Comment: occasional   . Drug use: Not on file     Allergies   Augmentin [amoxicillin-pot clavulanate]; Lotrel [amlodipine besy-benazepril hcl]; Paxil [paroxetine hcl]; Prednisone; Bactrim [sulfamethoxazole-trimethoprim]; Keflex [cephalexin]; and Levaquin [levofloxacin]   Review of Systems Review of Systems  Respiratory: Positive for cough, shortness of breath and wheezing.   All other systems reviewed and are negative.    Physical Exam Updated Vital Signs BP (!) 158/111 (BP Location: Right Arm)   Pulse (!) 101   Temp 98.6 F (37 C) (Oral)   Resp (!) 24   SpO2 99%   Physical Exam Vitals signs and nursing note reviewed.  Constitutional:      General: She is not in  acute distress.    Appearance: Normal appearance. She is well-developed.  HENT:     Head: Normocephalic and atraumatic.     Right Ear: Hearing normal.     Left Ear: Hearing normal.     Nose: Nose normal.  Eyes:     Conjunctiva/sclera: Conjunctivae normal.     Pupils: Pupils are equal, round, and reactive to light.  Neck:     Musculoskeletal: Normal range of motion and neck supple.  Cardiovascular:     Rate and Rhythm: Regular rhythm.     Heart sounds: S1 normal and S2 normal. No murmur. No friction rub. No gallop.   Pulmonary:     Effort: Tachypnea and accessory muscle usage present.     Breath sounds: Decreased breath sounds  and wheezing (diffuse) present.  Chest:     Chest wall: No tenderness.  Abdominal:     General: Bowel sounds are normal.     Palpations: Abdomen is soft.     Tenderness: There is no abdominal tenderness. There is no guarding or rebound. Negative signs include Murphy's sign and McBurney's sign.     Hernia: No hernia is present.  Musculoskeletal: Normal range of motion.  Skin:    General: Skin is warm and dry.     Findings: No rash.  Neurological:     Mental Status: She is alert and oriented to person, place, and time.     GCS: GCS eye subscore is 4. GCS verbal subscore is 5. GCS motor subscore is 6.     Cranial Nerves: No cranial nerve deficit.     Sensory: No sensory deficit.     Coordination: Coordination normal.  Psychiatric:        Speech: Speech normal.        Behavior: Behavior normal.        Thought Content: Thought content normal.      ED Treatments / Results  Labs (all labs ordered are listed, but only abnormal results are displayed) Labs Reviewed - No data to display  EKG None  Radiology Dg Chest 2 View  Result Date: 03/09/2018 CLINICAL DATA:  53 y/o F; flu like symptoms, shortness of breath, productive cough, chest pain, congestion. EXAM: CHEST - 2 VIEW COMPARISON:  03/31/2008 chest radiograph. FINDINGS: Normal stable cardiac  silhouette. Increased central reticular opacities. No consolidation, effusion, or pneumothorax. Bones are unremarkable. IMPRESSION: Increased central reticular opacities may represent bronchitis, reactive airways disease, or atypical pneumonia. No consolidation. Electronically Signed   By: Kristine Garbe M.D.   On: 03/09/2018 23:23    Procedures Procedures (including critical care time)  Medications Ordered in ED Medications  albuterol (PROVENTIL,VENTOLIN) solution continuous neb (has no administration in time range)  ipratropium (ATROVENT) nebulizer solution 0.5 mg (has no administration in time range)  albuterol (PROVENTIL) (2.5 MG/3ML) 0.083% nebulizer solution 5 mg (5 mg Nebulization Given 03/09/18 2251)     Initial Impression / Assessment and Plan / ED Course  I have reviewed the triage vital signs and the nursing notes.  Pertinent labs & imaging results that were available during my care of the patient were reviewed by me and considered in my medical decision making (see chart for details).    Patient presents to the emergency department for evaluation of cough, shortness of breath.  Patient has a history of asthma.  She has been sick for nearly 2 weeks.  She was initially treated with over-the-counter cold medicine and then was given what sounds like IM Decadron and albuterol without improvement.  Patient continues to have cough and worsening shortness of breath.  Chest x-ray does not show evidence of pneumonia.  Patient has been aggressively treated with continuous bronchodilator therapy.  She still has severe bronchospasm and shortness of breath, although somewhat improved.  She has already been treated as an outpatient, will require hospitalization for further management.  CRITICAL CARE Performed by: Orpah Greek   Total critical care time: 30 minutes  Critical care time was exclusive of separately billable procedures and treating other patients.  Critical  care was necessary to treat or prevent imminent or life-threatening deterioration.  Critical care was time spent personally by me on the following activities: development of treatment plan with patient and/or surrogate as well as nursing, discussions with consultants, evaluation  of patient's response to treatment, examination of patient, obtaining history from patient or surrogate, ordering and performing treatments and interventions, ordering and review of laboratory studies, ordering and review of radiographic studies, pulse oximetry and re-evaluation of patient's condition.    Final Clinical Impressions(s) / ED Diagnoses   Final diagnoses:  None    ED Discharge Orders    None       Orpah Greek, MD 03/10/18 [phone]

## 2018-03-10 NOTE — ED Notes (Signed)
Pt ambulated with steady gait with EMT; sats remained 91%, reports her sob is better

## 2018-03-10 NOTE — Care Management (Signed)
This is a no charge note  Pending admission per Dr. Betsey Holiday  53 year old lady with past medical history of asthma, GERD, depression, who presents with shortness of breath, dry cough for almost 2 weeks.  Patient also has wheezing, clinically consistent with asthma exacerbation.  Failed outpatient treatment by PCP.  Patient is placed on telemetry bed of observation.  Ivor Costa, MD  [facility] Pager [phone]  If 7PM-7AM, please contact night-coverage [email or web address] Password [facility] 03/10/2018, 6:39 AM

## 2018-03-11 DIAGNOSIS — D72829 Elevated white blood cell count, unspecified: Secondary | ICD-10-CM

## 2018-03-11 LAB — HIV ANTIBODY (ROUTINE TESTING W REFLEX): HIV Screen 4th Generation wRfx: NONREACTIVE

## 2018-03-11 LAB — CBC
HCT: 41.1 % (ref 36.0–46.0)
HEMOGLOBIN: 14.2 g/dL (ref 12.0–15.0)
MCH: 31.1 pg (ref 26.0–34.0)
MCHC: 34.5 g/dL (ref 30.0–36.0)
MCV: 89.9 fL (ref 80.0–100.0)
Platelets: 337 10*3/uL (ref 150–400)
RBC: 4.57 MIL/uL (ref 3.87–5.11)
RDW: 12.3 % (ref 11.5–15.5)
WBC: 20.3 10*3/uL — ABNORMAL HIGH (ref 4.0–10.5)
nRBC: 0 % (ref 0.0–0.2)

## 2018-03-11 MED ORDER — PREDNISONE 10 MG PO TABS: ORAL_TABLET | ORAL | 0 refills | Status: AC

## 2018-03-11 MED ORDER — MAGNESIUM SULFATE 2 GM/50ML IV SOLN
2.0000 g | Freq: Once | INTRAVENOUS | Status: AC
  Administered 2018-03-11: 2 g via INTRAVENOUS
  Filled 2018-03-11: qty 50

## 2018-03-11 MED ORDER — PANTOPRAZOLE SODIUM 40 MG PO TBEC
40.0000 mg | DELAYED_RELEASE_TABLET | Freq: Every day | ORAL | Status: DC
  Administered 2018-03-11: 40 mg via ORAL
  Filled 2018-03-11: qty 1

## 2018-03-11 MED ORDER — DOXYCYCLINE HYCLATE 100 MG PO TABS: 100.0000 mg | ORAL_TABLET | Freq: Two times a day (BID) | ORAL | 0 refills | Status: AC

## 2018-03-11 MED ORDER — ALBUTEROL SULFATE HFA 108 (90 BASE) MCG/ACT IN AERS: 2.0000 | INHALATION_SPRAY | RESPIRATORY_TRACT | 0 refills | Status: AC | PRN

## 2018-03-11 NOTE — Care Management (Signed)
Spoke w patient, she uses [facility] for presciption coverage and has checked the price for albuterol and states it is $40. No other CM needs identified.

## 2018-03-11 NOTE — Plan of Care (Signed)
Pt c/o headache that was relieved with PRN tylenol.

## 2018-03-12 NOTE — Discharge Summary (Addendum)
Morgan Hooper, is a 53 y.o. female  DOB 09-04-1965  MRN [id number].  Admission date:  03/09/2018  Admitting Physician  Ivor Costa, MD  Discharge Date:  03/12/2018   Primary MD  Antony Contras, MD  Recommendations for primary care physician for things to follow:    Check cbc and bmp   Admission Diagnosis  congestion, coughing and body ache   Discharge Diagnosis  congestion, coughing and body ache    Principal Problem:   Asthma exacerbation Active Problems:   Asthma exacerbation attacks      Past Medical History:  Diagnosis Date  . Asthma    uses albuterol   . Depression    takes wellbutrin   . GERD (gastroesophageal reflux disease)    takes Nexium   . Headache    takes topamax and Inderal   . History of hiatal hernia   . Vaginal delivery 1988, 1993    Past Surgical History:  Procedure Laterality Date  . BREAST BIOPSY Left    benign stero  . CHOLECYSTECTOMY    . DIAGNOSTIC LAPAROSCOPY     ectopic pregnancy took right fallopian tube in 1991  . DIAGNOSTIC LAPAROSCOPY     removal of cyst on right ovary   . LAPAROSCOPIC ASSISTED VAGINAL HYSTERECTOMY N/A 08/20/2014   Procedure: LAPAROSCOPIC ASSISTED VAGINAL HYSTERECTOMY;  Surgeon: [name], DO;  Location: [facility];  Service: Gynecology;  Laterality: N/A;  . LAPAROSCOPIC SALPINGO OOPHERECTOMY Bilateral 08/20/2014   Procedure: LAPAROSCOPIC OOPHORECTOMY;  Surgeon: [name], DO;  Location: [facility];  Service: Gynecology;  Laterality: Bilateral;  . LYMPH NODE BIOPSY    . MANDIBLE RECONSTRUCTION    . removal of neck tumor    . TYMPANOSTOMY TUBE PLACEMENT     as a child   . UNILATERAL SALPINGECTOMY Left 08/20/2014   Procedure: UNILATERAL SALPINGECTOMY;  Surgeon: [name], DO;  Location: [facility];  Service: Gynecology;  Laterality: Left;       HPI  from  the history and physical done on the day of admission:    Morgan Hooper is a 53 y.o. female with medical history significant of mild intermittent asthma, GERD who presents to the emergency department with about 2 weeks history of cough cold upper respiratory symptoms and wheezing.  According to the patient, after Christmas she started developing cough cold and congestion.  She went to urgent care next day, she was diagnosed with influenza B and was given symptomatic treatment with cough medications.  She continue to take those medications but they did not help.  Patient continued to have worsening wheezing, coughing with any activities including walking.  She has minimal mucoid sputum production.  Denies any fever or chills.  Currently denies any runny nose, denies any headache, nausea, vomiting.  No chest pain.  Denies any dizziness or lightheadedness.  No abdominal pain.  Bowel and urine habits are normal.  ED Course: In the emergency room, patient was hemodynamically stable.  She was on room air.  Patient was given a dose  of Solu-Medrol 125 mg, magnesium sulfate 2 g as well as 2 rounds of bronchodilator therapy.  Due to significant symptoms, advised admission and treatment in the hospital.     Hospital Course:    Acute asthma exacerbation with history of mild intermittent asthma: Patient notes labs asthma exacerbation over 20 years ago.  Patient was initially treated with IV Solu-Medrol, and bronchodilators.  Given the abnormal chest x-ray and reports of cough for over 2 weeks after being found influenza B+.  Patient was started on doxycycline.  Peak flow monitoring were ordered.  Patient was given additional therapies 2 g of Solu-Medrol IV.  Patient reports breathing significantly improved since this morning.  Scripts were sent to her pharmacy for steroid taper, albuterol inhaler, and doxycycline.   Leukocytosis: Acutely worsened.  WBC elevated at 11.8 to 20.3.  Suspect secondary to recently  given steroids.  Follow UP  Follow-up Information    Antony Contras, MD Follow up in 1 week(s).   Specialty:  Family Medicine Why:  call and make appoint if none made  Contact information: [place] [address] [phone]            Consults obtained: None  Discharge Condition: Stable  Diet and Activity recommendation: See Discharge Instructions below  Discharge Instructions    Recommended patient to take an antiacid along with probiotics while on steroids and doxycycline.     Discharge Medications     Allergies as of 03/11/2018      Reactions   Augmentin [amoxicillin-pot Clavulanate] Diarrhea, Nausea Only   DID THE REACTION INVOLVE: Swelling of the face/tongue/throat, SOB, or low BP? No Sudden or severe rash/hives, skin peeling, or the inside of the mouth or nose? No Did it require medical treatment? No When did it last happen?less than 5 years If all above answers are "NO", may proceed with cephalosporin use.   Lotrel [amlodipine Besy-benazepril Hcl] Cough   Paxil [paroxetine Hcl] Other (See Comments)   Causes daze feeling   Prednisone Other (See Comments)   Cannot tolerate oral but injection is ok   Bactrim [sulfamethoxazole-trimethoprim] Rash   Keflex [cephalexin] Rash   Levaquin [levofloxacin] Rash      Medication List    TAKE these medications   albuterol 108 (90 Base) MCG/ACT inhaler Commonly known as:  PROVENTIL HFA;VENTOLIN HFA Inhale 2 puffs into the lungs every 4 (four) hours as needed for wheezing or shortness of breath (cough, shortness of breath or wheezing.).   ALPRAZolam 1 MG tablet Commonly known as:  XANAX Take 2 mg by mouth at bedtime.   doxycycline 100 MG tablet Commonly known as:  VIBRA-TABS Take 1 tablet (100 mg total) by mouth every 12 (twelve) hours.   estradiol 0.0375 mg/24hr patch Commonly known as:  CLIMARA - Dosed in mg/24 hr Place 0.0375 mg onto the skin See admin instructions. Apply 1  patch every 4 days. (discard old patch)   Guaifenesin 1200 MG Tb12 Commonly known as:  MUCINEX MAXIMUM STRENGTH Take 1 tablet (1,200 mg total) by mouth every 12 (twelve) hours as needed.   predniSONE 10 MG tablet Commonly known as:  DELTASONE Taper take 40 mg po daily x 2 days, then 30 mg po daily x 2 days, then 20 mg po daily x 2 days, then stop       Major procedures and Radiology Reports - PLEASE review detailed and final reports for all details, in brief -     Dg Chest 2 View  Result Date: 03/09/2018 CLINICAL DATA:  53 y/o F; flu like symptoms, shortness of breath, productive cough, chest pain, congestion. EXAM: CHEST - 2 VIEW COMPARISON:  03/31/2008 chest radiograph. FINDINGS: Normal stable cardiac silhouette. Increased central reticular opacities. No consolidation, effusion, or pneumothorax. Bones are unremarkable. IMPRESSION: Increased central reticular opacities may represent bronchitis, reactive airways disease, or atypical pneumonia. No consolidation. Electronically Signed   By: Kristine Garbe M.D.   On: 03/09/2018 23:23    Micro Results    No results found for this or any previous visit (from the past 240 hour(s)).     Today   Subjective    Morgan Hooper now feels much better after receiving the albuterol treatments in the emergency department and steroids.     Objective   Blood pressure 128/67, pulse 92, temperature 97.9 F (36.6 C), temperature source Oral, resp. rate (!) 24, height 5\' 2"  (1.575 m), weight 83.6 kg, SpO2 96 %.  No intake or output data in the 24 hours ending 03/12/18 1602  Exam  Constitutional: NAD, calm, comfortable Eyes: PERRL, lids and conjunctivae normal ENMT: Mucous membranes are moist. Posterior pharynx clear of any exudate or lesions. Normal dentition.  Neck: normal, supple, no masses, no thyromegaly Respiratory: Normal respiratory effort at this time with mild expiratory wheeze appreciated.  No crackles or rhonchi  noted.  Patient able to talk in complete sentences at this time. Cardiovascular: Regular rate and rhythm, no murmurs / rubs / gallops. No extremity edema. 2+ pedal pulses. No carotid bruits.  Abdomen: no tenderness, no masses palpated. No hepatosplenomegaly. Bowel sounds positive.  Musculoskeletal: no clubbing / cyanosis. No joint deformity upper and lower extremities. Good ROM, no contractures. Normal muscle tone.  Skin: no rashes, lesions, ulcers. No induration Neurologic: CN 2-12 grossly intact. Sensation intact, DTR normal. Strength 5/5 in all 4.  Psychiatric: Normal judgment and insight. Alert and oriented x 3. Normal mood.    Data Review   CBC w Diff:  Lab Results  Component Value Date   WBC 20.3 (H) 03/11/2018   HGB 14.2 03/11/2018   HCT 41.1 03/11/2018   PLT 337 03/11/2018   LYMPHOPCT 17 03/10/2018   MONOPCT 7 03/10/2018   EOSPCT 6 03/10/2018   BASOPCT 1 03/10/2018    CMP:  Lab Results  Component Value Date   NA 137 03/10/2018   K 3.7 03/10/2018   CL 105 03/10/2018   CO2 17 (L) 03/10/2018   BUN 6 03/10/2018   CREATININE 1.01 (H) 03/10/2018   PROT 6.1 03/31/2008   ALBUMIN 3.5 03/31/2008   BILITOT 0.6 03/31/2008   ALKPHOS 79 03/31/2008   AST 28 03/31/2008   ALT 32 03/31/2008  .   Total Time in preparing paper work, data evaluation and todays exam - 35 minutes  Norval Morton M.D on 03/12/2018 at 4:02 PM  [facility]   Office  [phone]

## 2018-12-14 ENCOUNTER — Telehealth (HOSPITAL_COMMUNITY): Payer: Self-pay

## 2018-12-14 NOTE — Telephone Encounter (Signed)
Telephoned patient on 12/07/2018 and 12/14/2018. Left message for patient to call and schedule with [facility].

## 2018-12-19 ENCOUNTER — Other Ambulatory Visit: Payer: Self-pay | Admitting: *Deleted

## 2018-12-19 DIAGNOSIS — Z1231 Encounter for screening mammogram for malignant neoplasm of breast: Secondary | ICD-10-CM

## 2019-02-07 ENCOUNTER — Ambulatory Visit
Admission: RE | Admit: 2019-02-07 | Discharge: 2019-02-07 | Disposition: A | Discharge status: Home / Self Care | Payer: No Typology Code available for payment source | Source: Ambulatory Visit | Attending: Obstetrics and Gynecology | Admitting: Obstetrics and Gynecology

## 2019-02-07 ENCOUNTER — Ambulatory Visit (HOSPITAL_COMMUNITY)
Admission: RE | Admit: 2019-02-07 | Discharge: 2019-02-07 | Disposition: A | Discharge status: Home / Self Care | Payer: Self-pay | Source: Ambulatory Visit | Attending: Obstetrics and Gynecology | Admitting: Obstetrics and Gynecology

## 2019-02-07 ENCOUNTER — Encounter (HOSPITAL_COMMUNITY): Payer: Self-pay

## 2019-02-07 ENCOUNTER — Other Ambulatory Visit: Payer: Self-pay

## 2019-02-07 DIAGNOSIS — Z1231 Encounter for screening mammogram for malignant neoplasm of breast: Secondary | ICD-10-CM

## 2019-02-07 DIAGNOSIS — Z1239 Encounter for other screening for malignant neoplasm of breast: Secondary | ICD-10-CM | POA: Insufficient documentation

## 2019-02-07 NOTE — Patient Instructions (Signed)
Explained breast self awareness with Cristy Folks. Patient did not need a Pap smear today due to patient has a history of a hysterectomy for benign reasons. Let patient know that she doesn't need any further Pap smears due to her history of a hysterectomy for benign reasons. Referred patient to the [facility] for a screening mammogram. Appointment scheduled for Thursday, February 07, 2019 at 1040. Patient aware of appointment and will be there. Let [facility] know [facility] will follow up with her within the next couple weeks with results of mammogram by letter or phone. Cristy Folks verbalized understanding.  Ronnesha Mester, Arvil Chaco, RN 9:20 AM

## 2019-02-07 NOTE — Progress Notes (Signed)
No complaints today.   Pap Smear: Pap smear not completed today. Last Pap smear was 05/30/2014 at [facility] and normal. Per patient has a history of an abnormal Pap smear over 15 years ago that a repeat Pap smear was completed for follow-up. Patient stated she has had at least three normal Pap smears since abnormal. Patient has a history of a hysterectomy 08/20/2014 due to chronic pelvic pain and uterine prolapse. Patient doesn't need any further Pap smears due to her history of a hysterectomy for benign reasons per [facility] guidelines. Last Pap smear and hysterectomy results are in Epic.  Physical exam: Breasts Breasts symmetrical. No skin abnormalities bilateral breasts. No nipple retraction bilateral breasts. No nipple discharge bilateral breasts. No lymphadenopathy. No lumps palpated bilateral breasts. No complaints of pain or tenderness on exam. Referred patient to the [facility] for a screening mammogram. Appointment scheduled for Thursday, February 07, 2019 at 1040.        Pelvic/Bimanual No Pap smear completed today since patient has a history of a hysterectomy for benign reasons. Pap smear not indicated per [facility] guidelines.   Smoking History: Patient has never smoked.  Patient Navigation: Patient education provided. Access to services provided for patient through [facility] program.   Colorectal Cancer Screening: Per patient had a colonoscopy completed in 2011. No complaints today.   Breast and Cervical Cancer Risk Assessment: Patient has a family history of a great maternal aunt having breast cancer. Patient has no known genetic mutations or history of radiation treatment to the chest before age 61. Patient has no history of cervical dysplasia, immunocompromised, or DES exposure in-utero.  Risk Assessment    Risk Scores      02/07/2019   Last edited by: Loletta Parish, RN   5-year risk: 1.2 %   Lifetime risk: 8.8 %

## 2020-06-08 ENCOUNTER — Other Ambulatory Visit: Payer: Self-pay | Admitting: Family Medicine

## 2020-06-08 DIAGNOSIS — Z1231 Encounter for screening mammogram for malignant neoplasm of breast: Secondary | ICD-10-CM

## 2020-08-03 ENCOUNTER — Ambulatory Visit
Admission: RE | Admit: 2020-08-03 | Discharge: 2020-08-03 | Disposition: A | Discharge status: Home / Self Care | Payer: No Typology Code available for payment source | Source: Ambulatory Visit | Attending: Family Medicine | Admitting: Family Medicine

## 2020-08-03 ENCOUNTER — Other Ambulatory Visit: Payer: Self-pay

## 2020-08-03 DIAGNOSIS — Z1231 Encounter for screening mammogram for malignant neoplasm of breast: Secondary | ICD-10-CM | POA: Diagnosis not present

## 2020-08-04 ENCOUNTER — Ambulatory Visit: Payer: No Typology Code available for payment source

## 2020-08-21 DIAGNOSIS — K589 Irritable bowel syndrome without diarrhea: Secondary | ICD-10-CM | POA: Diagnosis not present

## 2020-08-21 DIAGNOSIS — R194 Change in bowel habit: Secondary | ICD-10-CM | POA: Diagnosis not present

## 2020-08-21 DIAGNOSIS — Z800 Family history of malignant neoplasm of digestive organs: Secondary | ICD-10-CM | POA: Diagnosis not present

## 2020-08-27 DIAGNOSIS — F419 Anxiety disorder, unspecified: Secondary | ICD-10-CM | POA: Diagnosis not present

## 2020-08-27 DIAGNOSIS — I10 Essential (primary) hypertension: Secondary | ICD-10-CM | POA: Diagnosis not present

## 2020-08-27 DIAGNOSIS — F900 Attention-deficit hyperactivity disorder, predominantly inattentive type: Secondary | ICD-10-CM | POA: Diagnosis not present

## 2020-08-27 DIAGNOSIS — J019 Acute sinusitis, unspecified: Secondary | ICD-10-CM | POA: Diagnosis not present

## 2020-08-28 ENCOUNTER — Other Ambulatory Visit: Payer: Self-pay | Admitting: Physician Assistant

## 2020-08-28 ENCOUNTER — Ambulatory Visit
Admission: RE | Admit: 2020-08-28 | Discharge: 2020-08-28 | Disposition: A | Discharge status: Home / Self Care | Payer: BC Managed Care – PPO | Source: Ambulatory Visit | Attending: Physician Assistant | Admitting: Physician Assistant

## 2020-08-28 DIAGNOSIS — R062 Wheezing: Secondary | ICD-10-CM

## 2020-08-28 DIAGNOSIS — R059 Cough, unspecified: Secondary | ICD-10-CM | POA: Diagnosis not present

## 2020-08-28 DIAGNOSIS — R0602 Shortness of breath: Secondary | ICD-10-CM | POA: Diagnosis not present

## 2020-09-26 DIAGNOSIS — J019 Acute sinusitis, unspecified: Secondary | ICD-10-CM | POA: Diagnosis not present

## 2020-09-26 DIAGNOSIS — R059 Cough, unspecified: Secondary | ICD-10-CM | POA: Diagnosis not present

## 2020-09-26 DIAGNOSIS — J40 Bronchitis, not specified as acute or chronic: Secondary | ICD-10-CM | POA: Diagnosis not present

## 2020-09-26 DIAGNOSIS — U071 COVID-19: Secondary | ICD-10-CM | POA: Diagnosis not present

## 2020-09-26 DIAGNOSIS — R030 Elevated blood-pressure reading, without diagnosis of hypertension: Secondary | ICD-10-CM | POA: Diagnosis not present

## 2020-10-02 DIAGNOSIS — U071 COVID-19: Secondary | ICD-10-CM | POA: Diagnosis not present

## 2020-10-05 DIAGNOSIS — U071 COVID-19: Secondary | ICD-10-CM | POA: Diagnosis not present

## 2020-11-27 DIAGNOSIS — R609 Edema, unspecified: Secondary | ICD-10-CM | POA: Diagnosis not present

## 2020-12-18 DIAGNOSIS — M25562 Pain in left knee: Secondary | ICD-10-CM | POA: Diagnosis not present

## 2020-12-18 DIAGNOSIS — M79642 Pain in left hand: Secondary | ICD-10-CM | POA: Diagnosis not present

## 2020-12-18 DIAGNOSIS — M79641 Pain in right hand: Secondary | ICD-10-CM | POA: Diagnosis not present

## 2020-12-23 DIAGNOSIS — K635 Polyp of colon: Secondary | ICD-10-CM | POA: Diagnosis not present

## 2020-12-23 DIAGNOSIS — Z800 Family history of malignant neoplasm of digestive organs: Secondary | ICD-10-CM | POA: Diagnosis not present

## 2020-12-23 DIAGNOSIS — K649 Unspecified hemorrhoids: Secondary | ICD-10-CM | POA: Diagnosis not present

## 2020-12-23 DIAGNOSIS — Z1211 Encounter for screening for malignant neoplasm of colon: Secondary | ICD-10-CM | POA: Diagnosis not present

## 2020-12-29 DIAGNOSIS — S60022D Contusion of left index finger without damage to nail, subsequent encounter: Secondary | ICD-10-CM | POA: Diagnosis not present

## 2021-01-01 DIAGNOSIS — M25642 Stiffness of left hand, not elsewhere classified: Secondary | ICD-10-CM | POA: Diagnosis not present

## 2021-01-01 DIAGNOSIS — R531 Weakness: Secondary | ICD-10-CM | POA: Diagnosis not present

## 2021-01-01 DIAGNOSIS — M79642 Pain in left hand: Secondary | ICD-10-CM | POA: Diagnosis not present

## 2021-01-01 DIAGNOSIS — M25442 Effusion, left hand: Secondary | ICD-10-CM | POA: Diagnosis not present

## 2021-01-05 DIAGNOSIS — M79642 Pain in left hand: Secondary | ICD-10-CM | POA: Diagnosis not present

## 2021-01-05 DIAGNOSIS — M25642 Stiffness of left hand, not elsewhere classified: Secondary | ICD-10-CM | POA: Diagnosis not present

## 2021-01-05 DIAGNOSIS — M25442 Effusion, left hand: Secondary | ICD-10-CM | POA: Diagnosis not present

## 2021-01-05 DIAGNOSIS — R531 Weakness: Secondary | ICD-10-CM | POA: Diagnosis not present

## 2021-01-12 DIAGNOSIS — R531 Weakness: Secondary | ICD-10-CM | POA: Diagnosis not present

## 2021-01-12 DIAGNOSIS — M25642 Stiffness of left hand, not elsewhere classified: Secondary | ICD-10-CM | POA: Diagnosis not present

## 2021-01-12 DIAGNOSIS — M25442 Effusion, left hand: Secondary | ICD-10-CM | POA: Diagnosis not present

## 2021-01-12 DIAGNOSIS — M79642 Pain in left hand: Secondary | ICD-10-CM | POA: Diagnosis not present

## 2021-01-15 DIAGNOSIS — M25442 Effusion, left hand: Secondary | ICD-10-CM | POA: Diagnosis not present

## 2021-01-15 DIAGNOSIS — M79642 Pain in left hand: Secondary | ICD-10-CM | POA: Diagnosis not present

## 2021-01-15 DIAGNOSIS — R531 Weakness: Secondary | ICD-10-CM | POA: Diagnosis not present

## 2021-01-15 DIAGNOSIS — M25642 Stiffness of left hand, not elsewhere classified: Secondary | ICD-10-CM | POA: Diagnosis not present

## 2021-01-19 DIAGNOSIS — M79642 Pain in left hand: Secondary | ICD-10-CM | POA: Diagnosis not present

## 2021-01-19 DIAGNOSIS — M25642 Stiffness of left hand, not elsewhere classified: Secondary | ICD-10-CM | POA: Diagnosis not present

## 2021-01-19 DIAGNOSIS — R531 Weakness: Secondary | ICD-10-CM | POA: Diagnosis not present

## 2021-01-19 DIAGNOSIS — M25442 Effusion, left hand: Secondary | ICD-10-CM | POA: Diagnosis not present

## 2021-01-20 DIAGNOSIS — Z0000 Encounter for general adult medical examination without abnormal findings: Secondary | ICD-10-CM | POA: Diagnosis not present

## 2021-01-20 DIAGNOSIS — M79646 Pain in unspecified finger(s): Secondary | ICD-10-CM | POA: Diagnosis not present

## 2021-01-20 DIAGNOSIS — F419 Anxiety disorder, unspecified: Secondary | ICD-10-CM | POA: Diagnosis not present

## 2021-01-20 DIAGNOSIS — I10 Essential (primary) hypertension: Secondary | ICD-10-CM | POA: Diagnosis not present

## 2021-01-20 DIAGNOSIS — F900 Attention-deficit hyperactivity disorder, predominantly inattentive type: Secondary | ICD-10-CM | POA: Diagnosis not present

## 2021-01-20 DIAGNOSIS — E782 Mixed hyperlipidemia: Secondary | ICD-10-CM | POA: Diagnosis not present

## 2021-01-29 DIAGNOSIS — M25642 Stiffness of left hand, not elsewhere classified: Secondary | ICD-10-CM | POA: Diagnosis not present

## 2021-01-29 DIAGNOSIS — R531 Weakness: Secondary | ICD-10-CM | POA: Diagnosis not present

## 2021-01-29 DIAGNOSIS — M25442 Effusion, left hand: Secondary | ICD-10-CM | POA: Diagnosis not present

## 2021-01-29 DIAGNOSIS — M79642 Pain in left hand: Secondary | ICD-10-CM | POA: Diagnosis not present

## 2021-02-05 DIAGNOSIS — R531 Weakness: Secondary | ICD-10-CM | POA: Diagnosis not present

## 2021-02-05 DIAGNOSIS — M25642 Stiffness of left hand, not elsewhere classified: Secondary | ICD-10-CM | POA: Diagnosis not present

## 2021-02-05 DIAGNOSIS — M79642 Pain in left hand: Secondary | ICD-10-CM | POA: Diagnosis not present

## 2021-02-05 DIAGNOSIS — M25442 Effusion, left hand: Secondary | ICD-10-CM | POA: Diagnosis not present

## 2021-03-27 DIAGNOSIS — S0993XA Unspecified injury of face, initial encounter: Secondary | ICD-10-CM | POA: Diagnosis not present

## 2021-03-27 DIAGNOSIS — W2201XA Walked into wall, initial encounter: Secondary | ICD-10-CM | POA: Diagnosis not present

## 2021-06-17 ENCOUNTER — Other Ambulatory Visit: Payer: Self-pay | Admitting: Family Medicine

## 2021-06-17 DIAGNOSIS — Z1231 Encounter for screening mammogram for malignant neoplasm of breast: Secondary | ICD-10-CM

## 2021-07-01 DIAGNOSIS — N200 Calculus of kidney: Secondary | ICD-10-CM | POA: Diagnosis not present

## 2021-07-01 DIAGNOSIS — M545 Low back pain, unspecified: Secondary | ICD-10-CM | POA: Diagnosis not present

## 2021-07-23 DIAGNOSIS — J45901 Unspecified asthma with (acute) exacerbation: Secondary | ICD-10-CM | POA: Diagnosis not present

## 2021-07-23 DIAGNOSIS — F419 Anxiety disorder, unspecified: Secondary | ICD-10-CM | POA: Diagnosis not present

## 2021-07-23 DIAGNOSIS — I10 Essential (primary) hypertension: Secondary | ICD-10-CM | POA: Diagnosis not present

## 2021-07-23 DIAGNOSIS — F900 Attention-deficit hyperactivity disorder, predominantly inattentive type: Secondary | ICD-10-CM | POA: Diagnosis not present

## 2021-07-23 DIAGNOSIS — E782 Mixed hyperlipidemia: Secondary | ICD-10-CM | POA: Diagnosis not present

## 2021-08-04 ENCOUNTER — Ambulatory Visit
Admission: RE | Admit: 2021-08-04 | Discharge: 2021-08-04 | Disposition: A | Discharge status: Home / Self Care | Payer: BC Managed Care – PPO | Source: Ambulatory Visit | Attending: Family Medicine | Admitting: Family Medicine

## 2021-08-04 DIAGNOSIS — Z1231 Encounter for screening mammogram for malignant neoplasm of breast: Secondary | ICD-10-CM

## 2021-09-17 ENCOUNTER — Emergency Department (HOSPITAL_COMMUNITY)
Admission: EM | Admit: 2021-09-17 | Discharge: 2021-09-17 | Disposition: A | Discharge status: Home / Self Care | Payer: BC Managed Care – PPO | Attending: Emergency Medicine | Admitting: Emergency Medicine

## 2021-09-17 ENCOUNTER — Encounter (HOSPITAL_COMMUNITY): Payer: Self-pay | Admitting: Emergency Medicine

## 2021-09-17 ENCOUNTER — Emergency Department (HOSPITAL_COMMUNITY): Payer: BC Managed Care – PPO

## 2021-09-17 ENCOUNTER — Other Ambulatory Visit: Payer: Self-pay

## 2021-09-17 DIAGNOSIS — R110 Nausea: Secondary | ICD-10-CM | POA: Insufficient documentation

## 2021-09-17 DIAGNOSIS — I10 Essential (primary) hypertension: Secondary | ICD-10-CM | POA: Insufficient documentation

## 2021-09-17 DIAGNOSIS — R918 Other nonspecific abnormal finding of lung field: Secondary | ICD-10-CM | POA: Diagnosis not present

## 2021-09-17 DIAGNOSIS — R519 Headache, unspecified: Secondary | ICD-10-CM | POA: Diagnosis not present

## 2021-09-17 DIAGNOSIS — G4453 Primary thunderclap headache: Secondary | ICD-10-CM | POA: Diagnosis not present

## 2021-09-17 DIAGNOSIS — J45909 Unspecified asthma, uncomplicated: Secondary | ICD-10-CM | POA: Insufficient documentation

## 2021-09-17 LAB — COMPREHENSIVE METABOLIC PANEL
ALT: 70 U/L — ABNORMAL HIGH (ref 0–44)
AST: 45 U/L — ABNORMAL HIGH (ref 15–41)
Albumin: 4.2 g/dL (ref 3.5–5.0)
Alkaline Phosphatase: 67 U/L (ref 38–126)
Anion gap: 10 (ref 5–15)
BUN: 9 mg/dL (ref 6–20)
CO2: 25 mmol/L (ref 22–32)
Calcium: 9.5 mg/dL (ref 8.9–10.3)
Chloride: 104 mmol/L (ref 98–111)
Creatinine, Ser: 0.95 mg/dL (ref 0.44–1.00)
GFR, Estimated: >60 mL/min (ref >60)
Glucose, Bld: 133 mg/dL — ABNORMAL HIGH (ref 70–99)
Potassium: 3.7 mmol/L (ref 3.5–5.1)
Sodium: 139 mmol/L (ref 135–145)
Total Bilirubin: 0.5 mg/dL (ref 0.3–1.2)
Total Protein: 6.4 g/dL — ABNORMAL LOW (ref 6.5–8.1)

## 2021-09-17 LAB — CBC WITH DIFFERENTIAL/PLATELET
Abs Immature Granulocytes: 0.04 10*3/uL (ref 0.00–0.07)
Basophils Absolute: 0.1 10*3/uL (ref 0.0–0.1)
Basophils Relative: 1 %
Eosinophils Absolute: 0.2 10*3/uL (ref 0.0–0.5)
Eosinophils Relative: 2 %
HCT: 42.1 % (ref 36.0–46.0)
Hemoglobin: 14.7 g/dL (ref 12.0–15.0)
Immature Granulocytes: 1 %
Lymphocytes Relative: 31 %
Lymphs Abs: 2.6 10*3/uL (ref 0.7–4.0)
MCH: 31.7 pg (ref 26.0–34.0)
MCHC: 34.9 g/dL (ref 30.0–36.0)
MCV: 90.9 fL (ref 80.0–100.0)
Monocytes Absolute: 0.6 10*3/uL (ref 0.1–1.0)
Monocytes Relative: 8 %
Neutro Abs: 4.7 10*3/uL (ref 1.7–7.7)
Neutrophils Relative %: 57 %
Platelets: 285 10*3/uL (ref 150–400)
RBC: 4.63 MIL/uL (ref 3.87–5.11)
RDW: 11.9 % (ref 11.5–15.5)
WBC: 8.2 10*3/uL (ref 4.0–10.5)
nRBC: 0 % (ref 0.0–0.2)

## 2021-09-17 LAB — TROPONIN I (HIGH SENSITIVITY)
Troponin I (High Sensitivity): 4 ng/L (ref <18)
Troponin I (High Sensitivity): 5 ng/L (ref <18)

## 2021-09-17 MED ORDER — METOCLOPRAMIDE HCL 5 MG/ML IJ SOLN
10.0000 mg | Freq: Once | INTRAMUSCULAR | Status: AC
  Administered 2021-09-17: 10 mg via INTRAVENOUS
  Filled 2021-09-17: qty 2

## 2021-09-17 MED ORDER — HYDRALAZINE HCL 25 MG PO TABS
25.0000 mg | ORAL_TABLET | Freq: Once | ORAL | Status: AC
  Administered 2021-09-17: 25 mg via ORAL
  Filled 2021-09-17: qty 1

## 2021-09-17 MED ORDER — DIPHENHYDRAMINE HCL 50 MG/ML IJ SOLN
25.0000 mg | Freq: Once | INTRAMUSCULAR | Status: AC
  Administered 2021-09-17: 25 mg via INTRAVENOUS
  Filled 2021-09-17: qty 1

## 2021-09-17 MED ORDER — IBUPROFEN 800 MG PO TABS: 800.0000 mg | ORAL_TABLET | Freq: Three times a day (TID) | ORAL | 0 refills | Status: AC | PRN

## 2021-09-17 MED ORDER — IOHEXOL 350 MG/ML SOLN
75.0000 mL | Freq: Once | INTRAVENOUS | Status: AC | PRN
  Administered 2021-09-17: 75 mL via INTRAVENOUS

## 2021-09-17 MED ORDER — KETOROLAC TROMETHAMINE 15 MG/ML IJ SOLN
15.0000 mg | Freq: Once | INTRAMUSCULAR | Status: AC
  Administered 2021-09-17: 15 mg via INTRAVENOUS
  Filled 2021-09-17: qty 1

## 2021-09-17 NOTE — ED Provider Notes (Signed)
[facility] EMERGENCY DEPARTMENT Provider Note   CSN: [id number] Arrival date & time: 09/17/21  1703     History  Chief Complaint  Patient presents with   Headache    Morgan Hooper is a 56 y.o. female.  Morgan Hooper is a 56 year old female with a past medical history of hypertension, asthma, GERD who presents with a sudden onset left sided frontal headache with associated dizziness and nausea that started at 7 AM this morning.  She has had migraines and tension headaches in the past but states that this is both worse and different from those.  She is not having any neck pain, neck stiffness, or posterior head pain.  The intensity of the headache has not changed since onset.  She had nausea but this has abated and she denied any vomiting.  She states that the dizziness feels like she is a little inebriated but she is able to walk without issue.  She denies any blurry vision, difficulty speaking, difficulty swallowing, chest pain, shortness of breath, cough, abdominal pain, syncope.  She denies taking any blood thinners but she did take 60 mg of her olmesartan of which she is prescribed 40 mg daily and this did not lower her blood pressure to normal range.  She went to the same-day clinic at her primary care physician's office and was sent here after she was found to have very high blood pressure and was complaining of a headache.   Headache Associated symptoms: dizziness and nausea   Associated symptoms: no abdominal pain, no cough, no fatigue, no fever, no neck pain, no neck stiffness, no numbness, no vomiting and no weakness        Home Medications Prior to Admission medications   Medication Sig Start Date End Date Taking? Authorizing Provider  ibuprofen (ADVIL) 800 MG tablet Take 1 tablet (800 mg total) by mouth every 8 (eight) hours as needed for headache. 09/17/21  Yes Johny Blamer, DO  albuterol (PROVENTIL HFA;VENTOLIN HFA) 108 (90 Base) MCG/ACT inhaler  Inhale 2 puffs into the lungs every 4 (four) hours as needed for wheezing or shortness of breath (cough, shortness of breath or wheezing.). 03/11/18   Norval Morton, MD  ALPRAZolam Duanne Moron) 1 MG tablet Take 2 mg by mouth at bedtime.     [provider]  doxycycline (VIBRA-TABS) 100 MG tablet Take 1 tablet (100 mg total) by mouth every 12 (twelve) hours. 03/11/18   Norval Morton, MD  estradiol (CLIMARA - DOSED IN MG/24 HR) 0.0375 mg/24hr patch Place 0.0375 mg onto the skin See admin instructions. Apply 1 patch every 4 days. (discard old patch)    [provider]  Guaifenesin (MUCINEX MAXIMUM STRENGTH) 1200 MG TB12 Take 1 tablet (1,200 mg total) by mouth every 12 (twelve) hours as needed. Patient not taking: Reported on 03/10/2018 02/26/18   Tenna Delaine D, PA-C  predniSONE (DELTASONE) 10 MG tablet Taper take 40 mg po daily x 2 days, then 30 mg po daily x 2 days, then 20 mg po daily x 2 days, then stop 03/11/18   Norval Morton, MD      Allergies    Augmentin [amoxicillin-pot clavulanate], Lotrel [amlodipine besy-benazepril hcl], Paxil [paroxetine hcl], Prednisone, Bactrim [sulfamethoxazole-trimethoprim], Keflex [cephalexin], and Levaquin [levofloxacin]    Review of Systems   Review of Systems  Constitutional:  Negative for activity change, chills, fatigue and fever.  Eyes:  Negative for visual disturbance.  Respiratory:  Negative for cough, chest tightness and shortness  of breath.   Cardiovascular:  Negative for chest pain, palpitations and leg swelling.  Gastrointestinal:  Positive for nausea. Negative for abdominal pain and vomiting.  Genitourinary:  Negative for difficulty urinating and dysuria.  Musculoskeletal:  Negative for neck pain and neck stiffness.  Neurological:  Positive for dizziness and headaches. Negative for syncope, facial asymmetry, speech difficulty, weakness and numbness.  Psychiatric/Behavioral:  Negative for confusion.     Physical  Exam Updated Vital Signs BP (!) 152/78   Pulse 78   Temp 98.7 F (37.1 C) (Oral)   Resp 18   SpO2 98%  Physical Exam Vitals reviewed.  Constitutional:      General: She is not in acute distress. Eyes:     General: No visual field deficit or scleral icterus.    Extraocular Movements: Extraocular movements intact.     Pupils: Pupils are equal, round, and reactive to light.  Cardiovascular:     Rate and Rhythm: Normal rate and regular rhythm.  Pulmonary:     Effort: Pulmonary effort is normal.     Breath sounds: Normal breath sounds.  Musculoskeletal:        General: No swelling.     Cervical back: Normal range of motion. No rigidity.  Neurological:     Mental Status: She is alert and oriented to person, place, and time.     Cranial Nerves: No cranial nerve deficit, dysarthria or facial asymmetry.     Sensory: No sensory deficit.     Motor: No weakness.     ED Results / Procedures / Treatments   Labs (all labs ordered are listed, but only abnormal results are displayed) Labs Reviewed  COMPREHENSIVE METABOLIC PANEL - Abnormal; Notable for the following components:      Result Value   Glucose, Bld 133 (*)    Total Protein 6.4 (*)    AST 45 (*)    ALT 70 (*)    All other components within normal limits  CBC WITH DIFFERENTIAL/PLATELET  URINALYSIS, ROUTINE W REFLEX MICROSCOPIC  TROPONIN I (HIGH SENSITIVITY)  TROPONIN I (HIGH SENSITIVITY)    EKG None  Radiology CT Angio Head W or Wo Contrast  Result Date: 09/17/2021 CLINICAL DATA:  Sudden onset severe headache EXAM: CT ANGIOGRAPHY HEAD TECHNIQUE: Multidetector CT imaging of the head was performed using the standard protocol during bolus administration of intravenous contrast. Multiplanar CT image reconstructions and MIPs were obtained to evaluate the vascular anatomy. RADIATION DOSE REDUCTION: This exam was performed according to the departmental dose-optimization program which includes automated exposure control,  adjustment of the mA and/or kV according to patient size and/or use of iterative reconstruction technique. CONTRAST:  53m OMNIPAQUE IOHEXOL 350 MG/ML SOLN COMPARISON:  None Available. FINDINGS: CT HEAD Brain: There is no mass, hemorrhage or extra-axial collection. The size and configuration of the ventricles and extra-axial CSF spaces are normal. The brain parenchyma is normal, without acute or chronic infarction. Vascular: No abnormal hyperdensity of the major intracranial arteries or dural venous sinuses. No intracranial atherosclerosis. Skull: The visualized skull base, calvarium and extracranial soft tissues are normal. Sinuses/Orbits: No fluid levels or advanced mucosal thickening of the visualized paranasal sinuses. No mastoid or middle ear effusion. The orbits are normal. CTA HEAD POSTERIOR CIRCULATION: --Vertebral arteries: Normal --Inferior cerebellar arteries: Normal. --Basilar artery: Normal. --Superior cerebellar arteries: Normal. --Posterior cerebral arteries: Normal. ANTERIOR CIRCULATION: --Intracranial internal carotid arteries: Normal. --Anterior cerebral arteries (ACA): Normal. --Middle cerebral arteries (MCA): Normal. ANATOMIC VARIANTS: Fetal origin of the left PCA. Review  of the MIP images confirms the above findings. IMPRESSION: Normal head CT and CTA. Electronically Signed   By: Ulyses Jarred M.D.   On: 09/17/2021 20:07   DG Chest 2 View  Result Date: 09/17/2021 CLINICAL DATA:  ACS EXAM: CHEST - 2 VIEW COMPARISON:  08/28/2020 FINDINGS: The heart size and mediastinal contours are within normal limits. Mild, diffuse bilateral interstitial pulmonary opacity. The visualized skeletal structures are unremarkable. IMPRESSION: Mild, diffuse bilateral interstitial pulmonary opacity, suggesting mild edema or atypical/viral infection. No focal airspace opacity. Electronically Signed   By: Delanna Ahmadi M.D.   On: 09/17/2021 18:50    Procedures Procedures    Medications Ordered in ED Medications   iohexol (OMNIPAQUE) 350 MG/ML injection 75 mL (75 mLs Intravenous Contrast Given 09/17/21 1941)  metoCLOPramide (REGLAN) injection 10 mg (10 mg Intravenous Given 09/17/21 2124)  diphenhydrAMINE (BENADRYL) injection 25 mg (25 mg Intravenous Given 09/17/21 2123)  ketorolac (TORADOL) 15 MG/ML injection 15 mg (15 mg Intravenous Given 09/17/21 2122)  hydrALAZINE (APRESOLINE) tablet 25 mg (25 mg Oral Given 09/17/21 2127)    ED Course/ Medical Decision Making/ A&P                           Medical Decision Making Lillyn Wieczorek 56 year old female with a past medical history of hypertension, asthma, GERD who presents with 12+ hours of acute onset left-sided frontal headache with associated nausea and dizziness as well as elevated blood pressure with systolics in the 213Y.  Work-up showed no biochemical evidence of endorgan damage and CTA of the head and neck showed no acute hemorrhagic stroke or aneurysm in the cerebral vasculature.  Patient did not have any focal neurologic deficits and did not develop any during her stay.  She was given a headache cocktail with metoclopramide, diphenhydramine, and Toradol.  She was also treated with 1 dose of 25 mg oral hydralazine.  These medications give relief of her headache and lowered her blood pressure to 865H systolic.  She remained stable throughout her visit and was discharged in stable condition with all questions answered prior to discharge.  She was prescribed Motrin 800 mg every 8 hours as needed for ongoing headache and instructed to follow-up with her primary care physician.  Problems Addressed: Acute nonintractable headache, unspecified headache type: acute illness or injury Hypertension, unspecified type: chronic illness or injury  Amount and/or Complexity of Data Reviewed Labs: ordered. Decision-making details documented in ED Course. Radiology: ordered and independent interpretation performed. Decision-making details documented in ED  Course.  Risk Prescription drug management.           Final Clinical Impression(s) / ED Diagnoses Final diagnoses:  Acute nonintractable headache, unspecified headache type  Hypertension, unspecified type    Rx / DC Orders ED Discharge Orders          Ordered    ibuprofen (ADVIL) 800 MG tablet  Every 8 hours PRN        09/17/21 2254              Johny Blamer, DO 09/17/21 2324    Lucrezia Starch, MD 09/18/21 [phone]

## 2021-09-17 NOTE — ED Provider Triage Note (Signed)
Emergency Medicine Provider Triage Evaluation Note  EMAAN GARY , a 56 y.o. female  was evaluated in triage.  Pt complains of hypertensive and headache.  Patient reports that she had a sudden onset of headache today at 7 AM.  Pain has gotten progressively worse since then.  Patient has history of migraines however has not had one in 7 years after having her hysterectomy.  Patient went to her PCP for evaluation and was noted to be hypertensive.  Review of Systems  Positive: Headache, lightheadedness, Negative: Visual disturbance, numbness, weakness, facial asymmetry, dysarthria, chest pain, shortness of breath  Physical Exam  BP (!) 222/101 (BP Location: Left Arm)   Pulse 94   Temp 98 F (36.7 C) (Oral)   Resp 16   SpO2 97%  Gen:   Awake, no distress   Resp:  Normal effort  MSK:   Moves extremities without difficulty  Other:  Patient moves all limbs equally without difficulty.  No facial asymmetry or dysarthria.  Grip clinically equal.  Medical Decision Making  Medically screening exam initiated at 5:14 PM.  Appropriate orders placed.  AVIS TIRONE was informed that the remainder of the evaluation will be completed by another provider, this initial triage assessment does not replace that evaluation, and the importance of remaining in the ED until their evaluation is complete.  With sudden onset of headache concern for possible subarachnoid hemorrhage, will obtain CTA of head for further evaluation.  Patient hypertensive, will orde labs to look for end organ damage consistent with hypertensive emergency.   Loni Beckwith, [place] 09/17/21 [phone]

## 2021-09-17 NOTE — Discharge Instructions (Addendum)
You were seen in the emergency department after experiencing a sudden onset headache on her left side with high blood pressures.  We did not see any evidence that your headache was due to a ongoing stroke or cerebral aneurysm.  We also did not see any evidence of damage to your heart or kidneys.  Please take prescribed Motrin 800 mg every 8 hours as needed for any ongoing headache or new headaches of this type.  If you experience worsening headache or any weakness, loss of consciousness, or new or worsening symptoms please return to the emergency department.  Please continue take your olmesartan as prescribed and follow-up with your primary care physician for further evaluation and management of your blood pressure and headaches.

## 2021-09-17 NOTE — ED Notes (Signed)
Pt verbalized understanding of d/c instructions, meds, and followup care. Denies questions. VSS, no distress noted. Steady gait to exit with all belongings.  ?

## 2021-09-17 NOTE — ED Notes (Signed)
Will come to room after ct

## 2021-09-17 NOTE — ED Triage Notes (Signed)
Patient here with complaint of a headache started this morning at 0700 and got progressively worse. Patient is alert, oriented, ambulatory, and in no apparent distress at this time.

## 2021-09-22 DIAGNOSIS — G4453 Primary thunderclap headache: Secondary | ICD-10-CM | POA: Diagnosis not present

## 2021-09-22 DIAGNOSIS — I10 Essential (primary) hypertension: Secondary | ICD-10-CM | POA: Diagnosis not present

## 2021-09-28 DIAGNOSIS — J029 Acute pharyngitis, unspecified: Secondary | ICD-10-CM | POA: Diagnosis not present

## 2021-09-28 DIAGNOSIS — R5383 Other fatigue: Secondary | ICD-10-CM | POA: Diagnosis not present

## 2021-10-06 DIAGNOSIS — I10 Essential (primary) hypertension: Secondary | ICD-10-CM | POA: Diagnosis not present

## 2022-01-05 DIAGNOSIS — R059 Cough, unspecified: Secondary | ICD-10-CM | POA: Diagnosis not present

## 2022-01-05 DIAGNOSIS — H6992 Unspecified Eustachian tube disorder, left ear: Secondary | ICD-10-CM | POA: Diagnosis not present

## 2022-01-05 DIAGNOSIS — J019 Acute sinusitis, unspecified: Secondary | ICD-10-CM | POA: Diagnosis not present

## 2022-01-05 DIAGNOSIS — Z03818 Encounter for observation for suspected exposure to other biological agents ruled out: Secondary | ICD-10-CM | POA: Diagnosis not present

## 2022-01-05 DIAGNOSIS — R5383 Other fatigue: Secondary | ICD-10-CM | POA: Diagnosis not present

## 2022-01-05 DIAGNOSIS — R0981 Nasal congestion: Secondary | ICD-10-CM | POA: Diagnosis not present

## 2022-01-05 DIAGNOSIS — J029 Acute pharyngitis, unspecified: Secondary | ICD-10-CM | POA: Diagnosis not present

## 2022-02-04 DIAGNOSIS — L821 Other seborrheic keratosis: Secondary | ICD-10-CM | POA: Diagnosis not present

## 2022-02-04 DIAGNOSIS — D2371 Other benign neoplasm of skin of right lower limb, including hip: Secondary | ICD-10-CM | POA: Diagnosis not present

## 2022-02-10 DIAGNOSIS — E782 Mixed hyperlipidemia: Secondary | ICD-10-CM | POA: Diagnosis not present

## 2022-02-10 DIAGNOSIS — R252 Cramp and spasm: Secondary | ICD-10-CM | POA: Diagnosis not present

## 2022-02-10 DIAGNOSIS — F900 Attention-deficit hyperactivity disorder, predominantly inattentive type: Secondary | ICD-10-CM | POA: Diagnosis not present

## 2022-02-10 DIAGNOSIS — Z0000 Encounter for general adult medical examination without abnormal findings: Secondary | ICD-10-CM | POA: Diagnosis not present

## 2022-02-10 DIAGNOSIS — I10 Essential (primary) hypertension: Secondary | ICD-10-CM | POA: Diagnosis not present

## 2022-02-17 DIAGNOSIS — R945 Abnormal results of liver function studies: Secondary | ICD-10-CM | POA: Diagnosis not present

## 2022-02-17 DIAGNOSIS — R7989 Other specified abnormal findings of blood chemistry: Secondary | ICD-10-CM | POA: Diagnosis not present

## 2022-02-18 ENCOUNTER — Other Ambulatory Visit: Payer: Self-pay | Admitting: Family Medicine

## 2022-02-18 DIAGNOSIS — R7989 Other specified abnormal findings of blood chemistry: Secondary | ICD-10-CM

## 2022-02-25 DIAGNOSIS — M25572 Pain in left ankle and joints of left foot: Secondary | ICD-10-CM | POA: Diagnosis not present

## 2022-02-25 DIAGNOSIS — M25372 Other instability, left ankle: Secondary | ICD-10-CM | POA: Diagnosis not present

## 2022-03-03 DIAGNOSIS — G4700 Insomnia, unspecified: Secondary | ICD-10-CM | POA: Diagnosis not present

## 2022-03-03 DIAGNOSIS — F419 Anxiety disorder, unspecified: Secondary | ICD-10-CM | POA: Diagnosis not present

## 2022-03-03 DIAGNOSIS — F439 Reaction to severe stress, unspecified: Secondary | ICD-10-CM | POA: Diagnosis not present

## 2022-03-03 DIAGNOSIS — I10 Essential (primary) hypertension: Secondary | ICD-10-CM | POA: Diagnosis not present

## 2022-03-16 ENCOUNTER — Ambulatory Visit
Admission: RE | Admit: 2022-03-16 | Discharge: 2022-03-16 | Disposition: A | Discharge status: Home / Self Care | Payer: BC Managed Care – PPO | Source: Ambulatory Visit | Attending: Family Medicine | Admitting: Family Medicine

## 2022-03-16 DIAGNOSIS — R945 Abnormal results of liver function studies: Secondary | ICD-10-CM | POA: Diagnosis not present

## 2022-03-16 DIAGNOSIS — R7989 Other specified abnormal findings of blood chemistry: Secondary | ICD-10-CM

## 2022-03-17 ENCOUNTER — Other Ambulatory Visit: Payer: Self-pay | Admitting: Family Medicine

## 2022-03-17 ENCOUNTER — Encounter: Payer: Self-pay | Admitting: Family Medicine

## 2022-03-17 DIAGNOSIS — R1013 Epigastric pain: Secondary | ICD-10-CM

## 2022-03-17 DIAGNOSIS — R7989 Other specified abnormal findings of blood chemistry: Secondary | ICD-10-CM

## 2022-03-17 DIAGNOSIS — M25372 Other instability, left ankle: Secondary | ICD-10-CM | POA: Diagnosis not present

## 2022-03-21 ENCOUNTER — Ambulatory Visit
Admission: RE | Admit: 2022-03-21 | Discharge: 2022-03-21 | Disposition: A | Discharge status: Home / Self Care | Payer: BC Managed Care – PPO | Source: Ambulatory Visit | Attending: Family Medicine | Admitting: Family Medicine

## 2022-03-21 DIAGNOSIS — R1013 Epigastric pain: Secondary | ICD-10-CM

## 2022-03-21 DIAGNOSIS — R7989 Other specified abnormal findings of blood chemistry: Secondary | ICD-10-CM | POA: Diagnosis not present

## 2022-03-21 DIAGNOSIS — N281 Cyst of kidney, acquired: Secondary | ICD-10-CM | POA: Diagnosis not present

## 2022-03-21 DIAGNOSIS — K449 Diaphragmatic hernia without obstruction or gangrene: Secondary | ICD-10-CM | POA: Diagnosis not present

## 2022-03-21 DIAGNOSIS — K760 Fatty (change of) liver, not elsewhere classified: Secondary | ICD-10-CM | POA: Diagnosis not present

## 2022-03-21 MED ORDER — IOPAMIDOL (ISOVUE-300) INJECTION 61%
100.0000 mL | Freq: Once | INTRAVENOUS | Status: AC | PRN
  Administered 2022-03-21: 100 mL via INTRAVENOUS

## 2022-03-22 ENCOUNTER — Other Ambulatory Visit: Payer: Self-pay | Admitting: Family Medicine

## 2022-03-22 DIAGNOSIS — Z1231 Encounter for screening mammogram for malignant neoplasm of breast: Secondary | ICD-10-CM

## 2022-04-01 DIAGNOSIS — M79672 Pain in left foot: Secondary | ICD-10-CM | POA: Diagnosis not present

## 2022-04-01 DIAGNOSIS — M25572 Pain in left ankle and joints of left foot: Secondary | ICD-10-CM | POA: Diagnosis not present

## 2022-04-01 DIAGNOSIS — S93492A Sprain of other ligament of left ankle, initial encounter: Secondary | ICD-10-CM | POA: Diagnosis not present

## 2022-04-01 DIAGNOSIS — M25372 Other instability, left ankle: Secondary | ICD-10-CM | POA: Diagnosis not present

## 2022-04-28 DIAGNOSIS — J029 Acute pharyngitis, unspecified: Secondary | ICD-10-CM | POA: Diagnosis not present

## 2022-04-28 DIAGNOSIS — Z03818 Encounter for observation for suspected exposure to other biological agents ruled out: Secondary | ICD-10-CM | POA: Diagnosis not present

## 2022-04-29 DIAGNOSIS — S93492A Sprain of other ligament of left ankle, initial encounter: Secondary | ICD-10-CM | POA: Diagnosis not present

## 2022-04-29 DIAGNOSIS — M25372 Other instability, left ankle: Secondary | ICD-10-CM | POA: Diagnosis not present

## 2022-04-29 DIAGNOSIS — M25572 Pain in left ankle and joints of left foot: Secondary | ICD-10-CM | POA: Diagnosis not present

## 2022-05-11 DIAGNOSIS — M25372 Other instability, left ankle: Secondary | ICD-10-CM | POA: Diagnosis not present

## 2022-05-18 DIAGNOSIS — M13872 Other specified arthritis, left ankle and foot: Secondary | ICD-10-CM | POA: Diagnosis not present

## 2022-05-18 DIAGNOSIS — M66372 Spontaneous rupture of flexor tendons, left ankle and foot: Secondary | ICD-10-CM | POA: Diagnosis not present

## 2022-07-15 DIAGNOSIS — M13872 Other specified arthritis, left ankle and foot: Secondary | ICD-10-CM | POA: Diagnosis not present

## 2022-08-09 ENCOUNTER — Ambulatory Visit
Admission: RE | Admit: 2022-08-09 | Discharge: 2022-08-09 | Disposition: A | Discharge status: Home / Self Care | Payer: BC Managed Care – PPO | Source: Ambulatory Visit

## 2022-08-09 DIAGNOSIS — Z1231 Encounter for screening mammogram for malignant neoplasm of breast: Secondary | ICD-10-CM

## 2023-02-02 IMAGING — CR DG CHEST 2V
2 series · 2 of 2 positions shown · non-contrast
Comparison: 03/09/2018

CLINICAL DATA: Productive cough for 6 days. Wheezing. Shortness of
breath.

EXAM:
CHEST - 2 VIEW

[w chest pa]
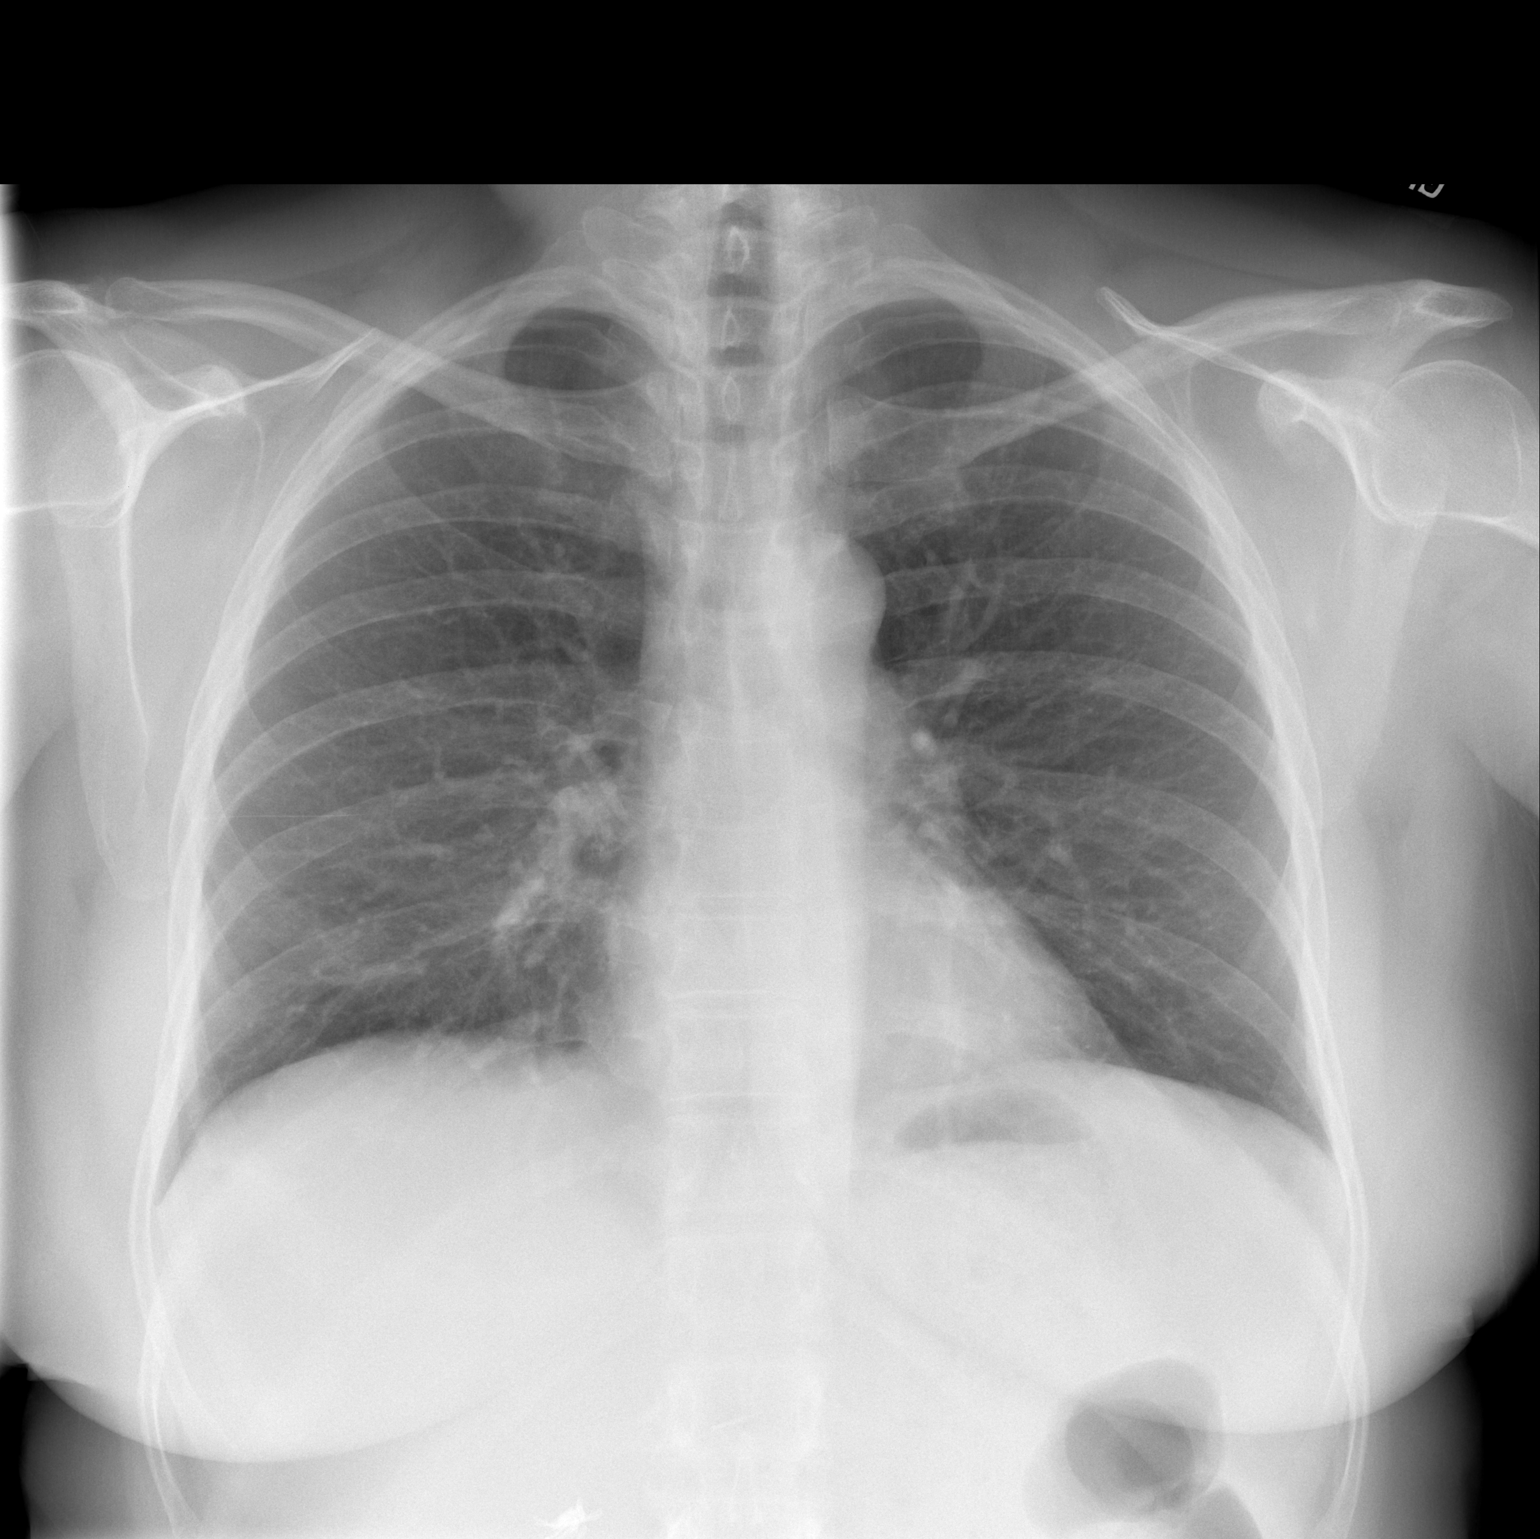

[w chest lat]
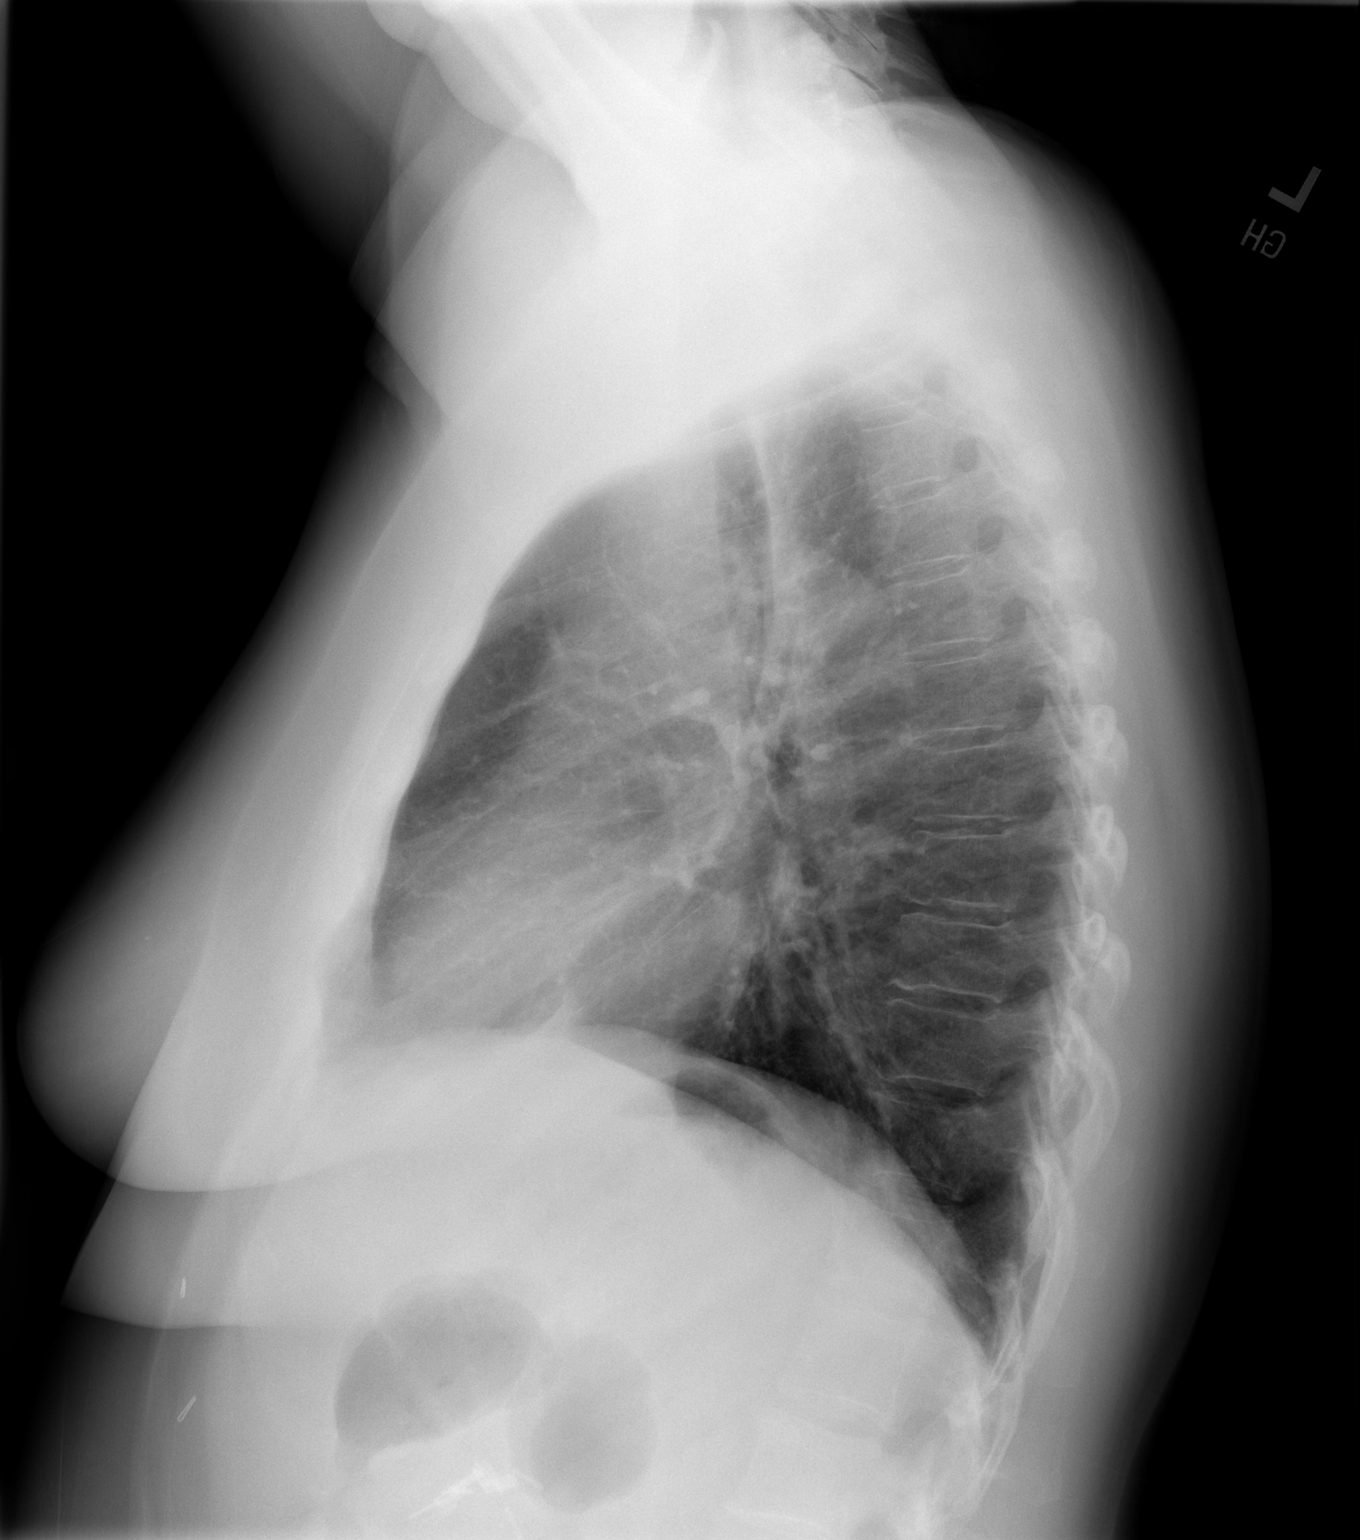

[2 of 2 positions shown; findings below may reference images not displayed]

FINDINGS: The heart size and mediastinal contours are within normal limits.
Both lungs are clear. The visualized skeletal structures are
unremarkable.
IMPRESSION: No active cardiopulmonary disease.
